# Patient Record
Sex: Female | Born: 1935 | Race: White | Hispanic: No | Smoking: Never smoker
Health system: Southern US, Community
[De-identification: ages and names within clinical notes are randomized; demographics above are authoritative.]

## PROBLEM LIST (undated history)

## (undated) DIAGNOSIS — M1990 Unspecified osteoarthritis, unspecified site: Secondary | ICD-10-CM

## (undated) DIAGNOSIS — E1149 Type 2 diabetes mellitus with other diabetic neurological complication: Secondary | ICD-10-CM

## (undated) DIAGNOSIS — G459 Transient cerebral ischemic attack, unspecified: Secondary | ICD-10-CM

## (undated) DIAGNOSIS — E876 Hypokalemia: Secondary | ICD-10-CM

## (undated) DIAGNOSIS — E039 Hypothyroidism, unspecified: Secondary | ICD-10-CM

## (undated) DIAGNOSIS — I10 Essential (primary) hypertension: Secondary | ICD-10-CM

## (undated) DIAGNOSIS — J1100 Influenza due to unidentified influenza virus with unspecified type of pneumonia: Secondary | ICD-10-CM

## (undated) DIAGNOSIS — F0390 Unspecified dementia without behavioral disturbance: Secondary | ICD-10-CM

## (undated) HISTORY — DX: Hypothyroidism, unspecified: E03.9

## (undated) HISTORY — DX: Unspecified osteoarthritis, unspecified site: M19.90

## (undated) HISTORY — PX: ABDOMINAL HYSTERECTOMY: SHX81

## (undated) HISTORY — DX: Type 2 diabetes mellitus with other diabetic neurological complication: E11.49

## (undated) HISTORY — DX: Transient cerebral ischemic attack, unspecified: G45.9

---
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
## 2000-11-17 ENCOUNTER — Emergency Department (HOSPITAL_COMMUNITY): Admission: EM | Admit: 2000-11-17 | Discharge: 2000-11-17 | Payer: Self-pay | Admitting: Emergency Medicine

## 2000-11-17 ENCOUNTER — Encounter: Payer: Self-pay | Admitting: Emergency Medicine

## 2000-11-18 ENCOUNTER — Encounter: Payer: Self-pay | Admitting: Emergency Medicine

## 2001-11-15 HISTORY — PX: OTHER SURGICAL HISTORY: SHX169

## 2004-06-19 ENCOUNTER — Emergency Department (HOSPITAL_COMMUNITY): Admission: EM | Admit: 2004-06-19 | Discharge: 2004-06-19 | Payer: Self-pay | Admitting: Emergency Medicine

## 2006-08-16 ENCOUNTER — Encounter: Admission: RE | Admit: 2006-08-16 | Discharge: 2006-08-16 | Payer: Self-pay | Admitting: Internal Medicine

## 2007-05-23 ENCOUNTER — Emergency Department (HOSPITAL_COMMUNITY): Admission: EM | Admit: 2007-05-23 | Discharge: 2007-05-23 | Payer: Self-pay | Admitting: Emergency Medicine

## 2007-08-01 ENCOUNTER — Ambulatory Visit (HOSPITAL_COMMUNITY): Admission: RE | Admit: 2007-08-01 | Discharge: 2007-08-01 | Payer: Self-pay | Admitting: Internal Medicine

## 2007-08-01 ENCOUNTER — Ambulatory Visit: Payer: Self-pay | Admitting: Vascular Surgery

## 2011-08-31 LAB — POCT URINALYSIS DIP (DEVICE)
Glucose, UA: NEGATIVE
Glucose, UA: NEGATIVE
Hgb urine dipstick: NEGATIVE
Hgb urine dipstick: NEGATIVE
Nitrite: NEGATIVE
Nitrite: NEGATIVE
Operator id: 235561
Specific Gravity, Urine: 1.025

## 2011-08-31 LAB — I-STAT 8, (EC8 V) (CONVERTED LAB)
Acid-Base Excess: 1
Chloride: 104
pCO2, Ven: 44 — ABNORMAL LOW
pH, Ven: 7.386 — ABNORMAL HIGH

## 2011-08-31 LAB — POCT I-STAT CREATININE
Creatinine, Ser: 0.8
Operator id: 116391

## 2011-08-31 LAB — DIFFERENTIAL
Basophils Absolute: 0
Eosinophils Relative: 2
Lymphocytes Relative: 24
Monocytes Absolute: 0.6

## 2011-08-31 LAB — CBC
HCT: 41.1
Hemoglobin: 14.3
MCHC: 34.6
RDW: 14.6 — ABNORMAL HIGH

## 2012-03-02 LAB — BASIC METABOLIC PANEL
Potassium: 4.3 mmol/L (ref 3.4–5.3)
Sodium: 140 mmol/L (ref 137–147)

## 2012-03-02 LAB — LIPID PANEL
Cholesterol: 185 mg/dL (ref 0–200)
HDL: 44 mg/dL (ref 35–70)
LDL Cholesterol: 98 mg/dL
Triglycerides: 216 mg/dL — AB (ref 40–160)

## 2012-03-02 LAB — TSH: TSH: 2.3 u[IU]/mL (ref 0.41–5.90)

## 2012-03-02 LAB — CBC AND DIFFERENTIAL
HCT: 44 % (ref 36–46)
Hemoglobin: 13.6 g/dL (ref 12.0–16.0)
Platelets: 231 10*3/uL (ref 150–399)
WBC: 7.7 10^3/mL

## 2012-04-04 ENCOUNTER — Other Ambulatory Visit: Payer: Self-pay | Admitting: Internal Medicine

## 2012-04-04 DIAGNOSIS — R413 Other amnesia: Secondary | ICD-10-CM

## 2012-04-05 ENCOUNTER — Other Ambulatory Visit: Payer: Self-pay

## 2012-04-05 ENCOUNTER — Ambulatory Visit
Admission: RE | Admit: 2012-04-05 | Discharge: 2012-04-05 | Disposition: A | Discharge status: Home / Self Care | Payer: Medicare Other | Source: Ambulatory Visit | Attending: Internal Medicine | Admitting: Internal Medicine

## 2012-04-05 DIAGNOSIS — R413 Other amnesia: Secondary | ICD-10-CM

## 2012-04-06 LAB — HEMOGLOBIN A1C: Hgb A1c MFr Bld: 6.5 % — AB (ref 4.0–6.0)

## 2012-04-13 ENCOUNTER — Other Ambulatory Visit: Payer: Self-pay

## 2012-04-28 ENCOUNTER — Other Ambulatory Visit: Payer: Self-pay | Admitting: Neurology

## 2012-04-28 DIAGNOSIS — R42 Dizziness and giddiness: Secondary | ICD-10-CM

## 2012-04-28 DIAGNOSIS — F09 Unspecified mental disorder due to known physiological condition: Secondary | ICD-10-CM

## 2012-04-28 DIAGNOSIS — R269 Unspecified abnormalities of gait and mobility: Secondary | ICD-10-CM

## 2012-06-15 ENCOUNTER — Ambulatory Visit
Admission: RE | Admit: 2012-06-15 | Discharge: 2012-06-15 | Disposition: A | Discharge status: Home / Self Care | Payer: Medicare Other | Source: Ambulatory Visit | Attending: Neurology | Admitting: Neurology

## 2012-06-15 DIAGNOSIS — F09 Unspecified mental disorder due to known physiological condition: Secondary | ICD-10-CM

## 2012-06-15 DIAGNOSIS — R269 Unspecified abnormalities of gait and mobility: Secondary | ICD-10-CM

## 2012-06-15 DIAGNOSIS — R42 Dizziness and giddiness: Secondary | ICD-10-CM

## 2012-10-02 ENCOUNTER — Encounter: Payer: Self-pay | Admitting: Internal Medicine

## 2012-10-08 ENCOUNTER — Encounter (HOSPITAL_COMMUNITY): Payer: Self-pay | Admitting: Nurse Practitioner

## 2012-10-08 ENCOUNTER — Emergency Department (HOSPITAL_COMMUNITY)
Admission: EM | Admit: 2012-10-08 | Discharge: 2012-10-08 | Disposition: A | Discharge status: Home / Self Care | Payer: Medicare Other | Attending: Emergency Medicine | Admitting: Emergency Medicine

## 2012-10-08 DIAGNOSIS — L0390 Cellulitis, unspecified: Secondary | ICD-10-CM

## 2012-10-08 DIAGNOSIS — F0390 Unspecified dementia without behavioral disturbance: Secondary | ICD-10-CM | POA: Insufficient documentation

## 2012-10-08 DIAGNOSIS — I8310 Varicose veins of unspecified lower extremity with inflammation: Secondary | ICD-10-CM | POA: Insufficient documentation

## 2012-10-08 DIAGNOSIS — E119 Type 2 diabetes mellitus without complications: Secondary | ICD-10-CM | POA: Insufficient documentation

## 2012-10-08 DIAGNOSIS — I10 Essential (primary) hypertension: Secondary | ICD-10-CM | POA: Insufficient documentation

## 2012-10-08 DIAGNOSIS — M7989 Other specified soft tissue disorders: Secondary | ICD-10-CM

## 2012-10-08 DIAGNOSIS — I872 Venous insufficiency (chronic) (peripheral): Secondary | ICD-10-CM

## 2012-10-08 DIAGNOSIS — L0291 Cutaneous abscess, unspecified: Secondary | ICD-10-CM | POA: Insufficient documentation

## 2012-10-08 HISTORY — DX: Unspecified dementia, unspecified severity, without behavioral disturbance, psychotic disturbance, mood disturbance, and anxiety: F03.90

## 2012-10-08 HISTORY — DX: Essential (primary) hypertension: I10

## 2012-10-08 LAB — COMPREHENSIVE METABOLIC PANEL
ALT: 14 U/L (ref 0–35)
Albumin: 3.4 g/dL — ABNORMAL LOW (ref 3.5–5.2)
Alkaline Phosphatase: 54 U/L (ref 39–117)
BUN: 21 mg/dL (ref 6–23)
Chloride: 100 mEq/L (ref 96–112)
GFR calc Af Amer: >90 mL/min (ref >90)
Glucose, Bld: 225 mg/dL — ABNORMAL HIGH (ref 70–99)
Potassium: 4.4 mEq/L (ref 3.5–5.1)
Sodium: 137 mEq/L (ref 135–145)
Total Bilirubin: 1 mg/dL (ref 0.3–1.2)

## 2012-10-08 LAB — CBC WITH DIFFERENTIAL/PLATELET
Hemoglobin: 13 g/dL (ref 12.0–15.0)
Lymphs Abs: 2.1 10*3/uL (ref 0.7–4.0)
Monocytes Relative: 6 % (ref 3–12)
Neutro Abs: 3.6 10*3/uL (ref 1.7–7.7)
Neutrophils Relative %: 56 % (ref 43–77)
RBC: 3.9 MIL/uL (ref 3.87–5.11)
WBC: 6.5 10*3/uL (ref 4.0–10.5)

## 2012-10-08 LAB — URINALYSIS, ROUTINE W REFLEX MICROSCOPIC
Bilirubin Urine: NEGATIVE
Glucose, UA: NEGATIVE mg/dL
Ketones, ur: NEGATIVE mg/dL
Protein, ur: NEGATIVE mg/dL

## 2012-10-08 LAB — URINE MICROSCOPIC-ADD ON

## 2012-10-08 MED ORDER — CLINDAMYCIN PHOSPHATE 600 MG/50ML IV SOLN
600.0000 mg | Freq: Once | INTRAVENOUS | Status: AC
  Administered 2012-10-08: 600 mg via INTRAVENOUS
  Filled 2012-10-08: qty 50

## 2012-10-08 MED ORDER — CEPHALEXIN 500 MG PO CAPS: 500.0000 mg | ORAL_CAPSULE | Freq: Four times a day (QID) | ORAL | Status: DC

## 2012-10-08 NOTE — ED Provider Notes (Signed)
History     CSN: [id number]  Arrival date & time 10/08/12  1025   First MD Initiated Contact with Patient 10/08/12 1027      Chief Complaint  Patient presents with  . Leg Swelling    (Consider location/radiation/quality/duration/timing/severity/associated sxs/prior treatment) HPI Comments: Patient with history of diabetes presents with complaint of bilateral lower extremity edema for the past 2-3 weeks. She has had some trouble with ambulation however she can ambulate. She states that she feels like her left leg is worse in her right. She has tenderness to her left leg. She denies fever, shortness of breath, chest pain. She does not have history of congestive heart failure, liver disease, kidney disease that she is aware of. She is not on any fluid pills. Onset gradual. Course is gradually worsening. Nothing makes symptoms better.   The history is provided by the patient.    Past Medical History  Diagnosis Date  . Diabetes mellitus without complication   . Hypertension   . Dementia     History reviewed. No pertinent past surgical history.  History reviewed. No pertinent family history.  History  Substance Use Topics  . Smoking status: Not on file  . Smokeless tobacco: Not on file  . Alcohol Use: No    OB History    Grav Para Term Preterm Abortions TAB SAB Ect Mult Living                  Review of Systems  Constitutional: Negative for fever.  HENT: Negative for sore throat and rhinorrhea.   Eyes: Negative for redness.  Respiratory: Negative for cough and shortness of breath.   Cardiovascular: Positive for leg swelling. Negative for chest pain.  Gastrointestinal: Negative for nausea, vomiting, abdominal pain and diarrhea.  Genitourinary: Negative for dysuria.  Musculoskeletal: Negative for myalgias.  Skin: Positive for color change. Negative for rash.  Neurological: Negative for headaches.    Allergies  Review of patient's allergies indicates no known  allergies.  Home Medications  No current outpatient prescriptions on file.  BP 172/49  Pulse 76  Temp 98.3 F (36.8 C) (Oral)  Resp 16  SpO2 96%  Physical Exam  Nursing note and vitals reviewed. Constitutional: She appears well-developed and well-nourished.  HENT:  Head: Normocephalic and atraumatic.  Eyes: Conjunctivae normal are normal. Right eye exhibits no discharge. Left eye exhibits no discharge.  Neck: Normal range of motion. Neck supple.  Cardiovascular: Normal rate, regular rhythm and normal heart sounds.   Pulmonary/Chest: Effort normal and breath sounds normal.  Abdominal: Soft. There is no tenderness.  Musculoskeletal: She exhibits edema.       Bilateral LE 2-3+ edema to knees. There is redness and warmth of L foot and ankle with mild tenderness. Scattered varicosities bilaterally.   Neurological: She is alert.  Skin: Skin is warm and dry.  Psychiatric: She has a normal mood and affect.    ED Course  Procedures (including critical care time)  Labs Reviewed  CBC WITH DIFFERENTIAL - Abnormal; Notable for the following:    Eosinophils Relative 6 (*)     All other components within normal limits  COMPREHENSIVE METABOLIC PANEL - Abnormal; Notable for the following:    Glucose, Bld 225 (*)     Albumin 3.4 (*)     GFR calc non Af Amer 81 (*)     All other components within normal limits  URINALYSIS, ROUTINE W REFLEX MICROSCOPIC - Abnormal; Notable for the following:    APPearance  CLOUDY (*)     Leukocytes, UA MODERATE (*)     All other components within normal limits  URINE MICROSCOPIC-ADD ON - Abnormal; Notable for the following:    Squamous Epithelial / LPF MANY (*)     Bacteria, UA MANY (*)     All other components within normal limits  URINE CULTURE   No results found.   1. Venous stasis dermatitis   2. Cellulitis     10:38 AM Patient seen and examined. Work-up initiated. Medications ordered. D/w Dr. Silverio Lay.   Vital signs reviewed and are as  follows: Filed Vitals:   10/08/12 1033  BP: 172/49  Pulse: 76  Temp: 98.3 F (36.8 C)  Resp: 16   12:42 PM Duplex neg for DVT although some vessels not seen. Do not suspect DVT given these findings.   Findings d/w patient and family. She is comfortable with d/c home and PCP f/u as planned this week. Urged elevation.   Pt urged to return with worsening pain, worsening swelling, expanding area of redness or streaking up extremity, fever, or any other concerns. Urged to take complete course of antibiotics as prescribed. Pt verbalizes understanding and agrees with plan.    MDM  Venous stasis and cellulitis -- DVT not likely, neg venous US. Patient non-toxic. 2+ pedal pulses.   Possible UTI - culture sent. Keflex may treat.         Renne Crigler, [place] 10/08/12 1600

## 2012-10-08 NOTE — Progress Notes (Signed)
*  Preliminary Results* Bilateral lower extremity venous duplex completed. Visualized veins of bilateral lower extremities are negative for deep vein thrombosis. Unable to visualize right peroneal veins and left posterior tibial and peroneal veins, therefore unable to exclude deep vein thrombosis in these vessels. No evidence of Baker's cyst bilaterally.  10/08/2012 11:31 AM Gertie Fey, RDMS, RDCS

## 2012-10-08 NOTE — ED Notes (Signed)
Per ems: pt from [facility] with lower extremity edema x 2 weeks. Ambulatory with assistance. 2/10 pain to BLE, LLE felt warm to touch, +3 pitting edema noted bilaterally, pain increased on palpation. Denies SOB or CP. Denies hx of CHF

## 2012-10-08 NOTE — ED Notes (Signed)
ultrasound at bedside.

## 2012-10-08 NOTE — ED Notes (Signed)
PA at bedside.

## 2012-10-09 NOTE — ED Provider Notes (Signed)
Medical screening examination/treatment/procedure(s) were conducted as a shared visit with non-physician practitioner(s) and myself.  I personally evaluated the patient during the encounter  Gal Smolinski is a 78 y.o. female hx of DM, HTN, mild dementia here with bilateral leg swelling for several weeks. No SOB and no hx of CHF. DVT study negative. She has venous stasis and possibly cellulitis on L leg. She also have UTI on UA without symptoms. She was d/c home on keflex.    Richardean Canal, MD 10/09/12 1500

## 2012-10-11 LAB — URINE CULTURE: Colony Count: 45000

## 2013-02-02 ENCOUNTER — Ambulatory Visit (INDEPENDENT_AMBULATORY_CARE_PROVIDER_SITE_OTHER): Payer: Medicare Other | Admitting: Internal Medicine

## 2013-02-02 ENCOUNTER — Encounter: Payer: Self-pay | Admitting: Internal Medicine

## 2013-02-02 ENCOUNTER — Other Ambulatory Visit (INDEPENDENT_AMBULATORY_CARE_PROVIDER_SITE_OTHER): Payer: Medicare Other

## 2013-02-02 VITALS — BP 152/68 | HR 80 | Temp 98.0°F | Ht 65.0 in | Wt 195.0 lb

## 2013-02-02 DIAGNOSIS — E039 Hypothyroidism, unspecified: Secondary | ICD-10-CM

## 2013-02-02 DIAGNOSIS — F0390 Unspecified dementia without behavioral disturbance: Secondary | ICD-10-CM | POA: Insufficient documentation

## 2013-02-02 DIAGNOSIS — I10 Essential (primary) hypertension: Secondary | ICD-10-CM

## 2013-02-02 DIAGNOSIS — E1142 Type 2 diabetes mellitus with diabetic polyneuropathy: Secondary | ICD-10-CM

## 2013-02-02 DIAGNOSIS — E1149 Type 2 diabetes mellitus with other diabetic neurological complication: Secondary | ICD-10-CM

## 2013-02-02 DIAGNOSIS — G459 Transient cerebral ischemic attack, unspecified: Secondary | ICD-10-CM | POA: Insufficient documentation

## 2013-02-02 LAB — LIPID PANEL
HDL: 34.1 mg/dL — ABNORMAL LOW (ref >39.00)
Total CHOL/HDL Ratio: 4
Triglycerides: 230 mg/dL — ABNORMAL HIGH (ref 0.0–149.0)
VLDL: 46 mg/dL — ABNORMAL HIGH (ref 0.0–40.0)

## 2013-02-02 LAB — LDL CHOLESTEROL, DIRECT: Direct LDL: 76.5 mg/dL

## 2013-02-02 LAB — HEMOGLOBIN A1C: Hgb A1c MFr Bld: 6.7 % — ABNORMAL HIGH (ref 4.6–6.5)

## 2013-02-02 LAB — MICROALBUMIN / CREATININE URINE RATIO: Microalb Creat Ratio: 1 mg/g (ref 0.0–30.0)

## 2013-02-02 MED ORDER — INSULIN NPH ISOPHANE & REGULAR (70-30) 100 UNIT/ML ~~LOC~~ SUSP: 30.0000 [IU] | Freq: Two times a day (BID) | SUBCUTANEOUS | Status: DC

## 2013-02-02 MED ORDER — GLIPIZIDE 5 MG PO TABS: 5.0000 mg | ORAL_TABLET | Freq: Two times a day (BID) | ORAL | Status: DC

## 2013-02-02 MED ORDER — DONEPEZIL HCL 5 MG PO TABS: 5.0000 mg | ORAL_TABLET | Freq: Every evening | ORAL | Status: DC | PRN

## 2013-02-02 NOTE — Patient Instructions (Signed)
It was good to see you today. We have reviewed your prior records including labs and tests today Test(s) ordered today. Your results will be released to MyChart (or called to you) after review, usually within 72hours after test completion. If any changes need to be made, you will be notified at that same time. Medications reviewed - change insulin to pen and use 30 units 2x/day Start low dose generic Aricept for memory as discussed No other changes recommended  Your prescription(s) have been submitted to your pharmacy. Please take as directed and contact our office if you believe you are having problem(s) with the medication(s). Continue working with Dr Anne Hahn and other specialists as ongoing Please schedule followup in 3-4 months for diabetes mellitus check, call sooner if problems.

## 2013-02-02 NOTE — Progress Notes (Signed)
Subjective:    Patient ID: Carol Santiago, female    DOB: October 13, 1925, 79 y.o.   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 Reports occ symptoms hypoglycemia. Son concerned re: amount of insulin pt drawing up/administering to self (dementia)  Hypertension - the patient reports compliance with medication(s) as prescribed. Denies adverse side effects.  Hypothyroid - the patient reports compliance with medication(s) as prescribed. Denies adverse side effects.  Dementia - follows with neuro Anne Hahn) for same - reports not ever on meds for same (though neuro note reports otherwise) - moved to [facility] summer 2013 to be close to family due to same - in [facility] at [facility] since 09/2012  Past Medical History  Diagnosis Date  . Diabetes mellitus type 2 with neurological manifestations   . Hypertension   . Dementia   . Arthritis   . Hypothyroid   . TIA (transient ischemic attack)   . Osteoarthritis    Family History  Problem Relation Age of Onset  . Arthritis Mother   . Stroke Mother   . Hypertension Mother   . Colon cancer Father   . Lung cancer Brother   . Kidney cancer Brother   . Diabetes Maternal Aunt    History  Substance Use Topics  . Smoking status: Never Smoker   . Smokeless tobacco: Not on file  . Alcohol Use: No    Review of Systems Constitutional: Negative for fever or weight change.  Respiratory: Negative for cough and shortness of breath.   Cardiovascular: Negative for chest pain or palpitations.  Gastrointestinal: Negative for abdominal pain, no bowel changes.  Musculoskeletal: Negative for gait problem or joint swelling.  Skin: Negative for rash.  Neurological: Negative for dizziness or headache.  No other  specific complaints in a complete review of systems (except as listed in HPI above).     Objective:   Physical Exam BP 152/68  Pulse 80  Temp(Src) 98 F (36.7 C) (Oral)  Ht 5\' 5"  (1.651 m)  Wt 195 lb (88.451 kg)  BMI 32.45 kg/m2  SpO2 95% Wt Readings from Last 3 Encounters:  02/02/13 195 lb (88.451 kg)   Constitutional: She appears well-developed and well-nourished. No distress. Mildly disheveled. Son (POA) at side  HENT: Head: Normocephalic and atraumatic. Ears: HOH B. B TMs ok, no erythema or effusion; Nose: Nose normal. Mouth/Throat: Oropharynx is clear and moist. No oropharyngeal exudate.  Eyes: Conjunctivae and EOM are normal. Pupils are equal, round, and reactive to light. No scleral icterus.  Neck: Normal range of motion. Neck supple. No JVD present. No thyromegaly present.  Cardiovascular: Normal rate, regular rhythm and normal heart sounds.  No murmur heard. No BLE edema. Pulmonary/Chest: Effort normal and breath sounds normal. No respiratory distress. She has no wheezes.  Abdominal: Soft. Bowel sounds are normal. She exhibits no distension. There is no tenderness. no masses Musculoskeletal: Normal range of motion, no joint effusions. No gross deformities Neurological: She is alert and oriented to person, place, and time. Fair recall. No cranial nerve deficit. Coordination, speech and balance normal. Walks with 4pt cane for support.  Skin: Skin is pale tone, but warm and dry. No rash noted. No erythema. mild venous stasis changes distal BLE Psychiatric: She has a normal  mood and affect. Her behavior is pleasant and interactive, but distracted. Judgment and thought content fair.   Lab Results  Component Value Date   WBC 6.5 10/08/2012   HGB 13.0 10/08/2012   HCT 38.6 10/08/2012   PLT 180 10/08/2012   GLUCOSE 225* 10/08/2012   ALT 14 10/08/2012   AST 25 10/08/2012   NA 137 10/08/2012   K 4.4 10/08/2012   CL 100 10/08/2012   CREATININE 0.60 10/08/2012   BUN 21  10/08/2012   CO2 26 10/08/2012       Assessment & Plan:   See problem list. Medications and labs reviewed today.  Time spent with pt/family today 60 minutes, greater than 50% time spent counseling patient on diabetes, dementia and medication review. Also review of prior records available (brought by son and in EMR)

## 2013-02-04 NOTE — Assessment & Plan Note (Signed)
Check TSH now and annually, adjust dose as needed Lab Results  Component Value Date   TSH 2.65 02/02/2013

## 2013-02-04 NOTE — Assessment & Plan Note (Signed)
We reviewed hypoglycemic events and concern for dementia affecting pt's ability to draw up and administer proper rx 70/30 Change to Pen administration 70/30 and reduce dose of same (40U BID, advise 30 U BID), also reduce OHA dose Consider DC of either med if hypoglycemia persists Reports intol of statin - on ACEI Follows with optho annually Check a1c q3-5mo and adjust prn Lab Results  Component Value Date   HGBA1C 6.7* 02/02/2013

## 2013-02-04 NOTE — Assessment & Plan Note (Signed)
BP Readings from Last 3 Encounters:  02/02/13 152/68  10/08/12 132/67   subop control of same today - but son/pt report usually well controlled As the current medical regimen is generally effective;  continue present plan and medications.  Family/pt to monitor and call if SBP>140

## 2013-02-04 NOTE — Assessment & Plan Note (Signed)
Follows with neuro (willis) for same Never began aricept as rx'd 07/2012 (per EMR notes) or increase dose 09/2012 as rx'd Start low dose now - reviewed same with POA/son and pt we reviewed potential risk/benefit and possible side effects - pt understands and agrees to same  To follow up with neuro on same

## 2013-02-24 ENCOUNTER — Other Ambulatory Visit: Payer: Self-pay | Admitting: Neurology

## 2013-04-23 ENCOUNTER — Ambulatory Visit (INDEPENDENT_AMBULATORY_CARE_PROVIDER_SITE_OTHER): Payer: Medicare Other | Admitting: Internal Medicine

## 2013-04-23 ENCOUNTER — Encounter: Payer: Self-pay | Admitting: Internal Medicine

## 2013-04-23 VITALS — BP 128/62 | HR 77 | Temp 97.6°F | Ht 65.0 in | Wt 192.0 lb

## 2013-04-23 DIAGNOSIS — E1142 Type 2 diabetes mellitus with diabetic polyneuropathy: Secondary | ICD-10-CM

## 2013-04-23 DIAGNOSIS — E1149 Type 2 diabetes mellitus with other diabetic neurological complication: Secondary | ICD-10-CM

## 2013-04-23 NOTE — Patient Instructions (Signed)

## 2013-04-23 NOTE — Progress Notes (Signed)
Subjective:    Patient ID: Carol Santiago, female    DOB: [birth year], 79 y.o.   MRN: [id number]  HPI  Pt presents to the clinic today with c/o hypoglycemic events. This has been going on for the past month. She is not checking her blood sugar. When she has these episodes, she feels shaky. Son is concerned with the patients ability to give herself the correct amount of insulin each day. At her last visit, Dr. Felicity Coyer switched her from the vials to the pens and reduce her insulin down to 30 units BID from 40 units BID. She is still using the vials because her insurance would not pay for the pens. She has titrated her insulin down to 15 unit BID. Her last A1C in March 2014 was 6.7.   Review of Systems      Past Medical History  Diagnosis Date  . Diabetes mellitus type 2 with neurological manifestations   . Hypertension   . Dementia   . Arthritis   . Hypothyroid   . TIA (transient ischemic attack)   . Osteoarthritis     Current Outpatient Prescriptions  Medication Sig Dispense Refill  . donepezil (ARICEPT) 5 MG tablet Take 1 tablet (5 mg total) by mouth at bedtime as needed.  30 tablet  3  . glipiZIDE (GLUCOTROL) 5 MG tablet Take 1 tablet (5 mg total) by mouth 2 (two) times daily before a meal.  60 tablet  3  . insulin NPH-insulin regular (HUMULIN 70/30 KWIKPEN) (70-30) 100 UNIT/ML injection Inject 30 Units into the skin 2 (two) times daily with a meal.  10 mL  12  . levothyroxine (SYNTHROID, LEVOTHROID) 75 MCG tablet TAKE ONE TABLET BY MOUTH EVERY DAY  90 tablet  2  . lisinopril (PRINIVIL,ZESTRIL) 5 MG tablet TAKE ONE TABLET BY MOUTH EVERY DAY  90 tablet  2   No current facility-administered medications for this visit.    No Known Allergies  Family History  Problem Relation Age of Onset  . Arthritis Mother   . Stroke Mother   . Hypertension Mother   . Colon cancer Father   . Lung cancer Brother   . Kidney cancer Brother   . Diabetes Maternal Aunt     History   Social  History  . Marital Status: Divorced    Spouse Name: N/A    Number of Children: N/A  . Years of Education: N/A   Occupational History  . Not on file.   Social History Main Topics  . Smoking status: Never Smoker   . Smokeless tobacco: Not on file  . Alcohol Use: No  . Drug Use: No  . Sexually Active: Not on file   Other Topics Concern  . Not on file   Social History Narrative  . No narrative on file     Constitutional: Denies fever, malaise, fatigue, headache or abrupt weight changes.  HEENT: Denies eye pain, eye redness, ear pain, ringing in the ears, wax buildup, runny nose, nasal congestion, bloody nose, or sore throat. Respiratory: Denies difficulty breathing, shortness of breath, cough or sputum production.   Cardiovascular: Denies chest pain, chest tightness, palpitations or swelling in the hands or feet.   Neurological: Pt reports difficulty with memory. Denies dizziness, difficulty with speech or problems with balance and coordination.   No other specific complaints in a complete review of systems (except as listed in HPI above).  Objective:   Physical Exam    BP 128/62  Pulse 77  Temp(Src) 97.6  F (36.4 C) (Oral)  Ht 5\' 5"  (1.651 m)  Wt 192 lb (87.091 kg)  BMI 31.95 kg/m2  SpO2 96% Wt Readings from Last 3 Encounters:  04/23/13 192 lb (87.091 kg)  02/02/13 195 lb (88.451 kg)    General: Appears her stated age, well developed, well nourished in NAD. Skin: Warm, dry and intact. No rashes, lesions or ulcerations noted. HEENT: Head: normal shape and size; Eyes: sclera white, no icterus, conjunctiva pink, PERRLA and EOMs intact; Ears: Tm's gray and intact, normal light reflex; Nose: mucosa pink and moist, septum midline; Throat/Mouth: Teeth present, mucosa pink and moist, no exudate, lesions or ulcerations noted.  Neck: Normal range of motion. Neck supple, trachea midline. No massses, lumps or thyromegaly present.  Cardiovascular: Normal rate and rhythm. S1,S2  noted.  No murmur, rubs or gallops noted. No JVD or BLE edema. No carotid bruits noted. Pulmonary/Chest: Normal effort and positive vesicular breath sounds. No respiratory distress. No wheezes, rales or ronchi noted.   Neurological: Alert and oriented. Cranial nerves II-XII intact. Coordination normal. +DTRs bilaterally.  BMET    Component Value Date/Time   NA 137 10/08/2012 1050   NA 140 03/02/2012   K 4.4 10/08/2012 1050   CL 100 10/08/2012 1050   CO2 26 10/08/2012 1050   GLUCOSE 225* 10/08/2012 1050   BUN 21 10/08/2012 1050   BUN 23* 03/02/2012   CREATININE 0.60 10/08/2012 1050   CREATININE 0.7 03/02/2012   CALCIUM 9.2 10/08/2012 1050   GFRNONAA 81* 10/08/2012 1050   GFRAA >90 10/08/2012 1050    Lipid Panel     Component Value Date/Time   CHOL 142 02/02/2013 1053   TRIG 230.0* 02/02/2013 1053   HDL 34.10* 02/02/2013 1053   CHOLHDL 4 02/02/2013 1053   VLDL 46.0* 02/02/2013 1053   LDLCALC 98 03/02/2012    CBC    Component Value Date/Time   WBC 6.5 10/08/2012 1050   WBC 7.7 03/02/2012   RBC 3.90 10/08/2012 1050   HGB 13.0 10/08/2012 1050   HCT 38.6 10/08/2012 1050   PLT 180 10/08/2012 1050   MCV 99.0 10/08/2012 1050   MCH 33.3 10/08/2012 1050   MCHC 33.7 10/08/2012 1050   RDW 13.7 10/08/2012 1050   LYMPHSABS 2.1 10/08/2012 1050   MONOABS 0.4 10/08/2012 1050   EOSABS 0.4 10/08/2012 1050   BASOSABS 0.0 10/08/2012 1050    Hgb A1C Lab Results  Component Value Date   HGBA1C 6.7* 02/02/2013       Assessment & Plan:

## 2013-04-23 NOTE — Assessment & Plan Note (Signed)
Ongoing hypoglycemic episodes Will d/c insulin at this time Continue with glipizide  RTC in 2 months to recheck A1C

## 2013-05-28 ENCOUNTER — Other Ambulatory Visit (INDEPENDENT_AMBULATORY_CARE_PROVIDER_SITE_OTHER): Payer: Medicare Other

## 2013-05-28 ENCOUNTER — Encounter: Payer: Self-pay | Admitting: Internal Medicine

## 2013-05-28 ENCOUNTER — Ambulatory Visit (INDEPENDENT_AMBULATORY_CARE_PROVIDER_SITE_OTHER): Payer: Medicare Other | Admitting: Internal Medicine

## 2013-05-28 VITALS — BP 162/70 | HR 75 | Temp 97.7°F | Wt 188.8 lb

## 2013-05-28 DIAGNOSIS — N390 Urinary tract infection, site not specified: Secondary | ICD-10-CM

## 2013-05-28 DIAGNOSIS — I10 Essential (primary) hypertension: Secondary | ICD-10-CM

## 2013-05-28 DIAGNOSIS — F0390 Unspecified dementia without behavioral disturbance: Secondary | ICD-10-CM

## 2013-05-28 DIAGNOSIS — E785 Hyperlipidemia, unspecified: Secondary | ICD-10-CM

## 2013-05-28 DIAGNOSIS — R4182 Altered mental status, unspecified: Secondary | ICD-10-CM

## 2013-05-28 DIAGNOSIS — G459 Transient cerebral ischemic attack, unspecified: Secondary | ICD-10-CM

## 2013-05-28 LAB — BASIC METABOLIC PANEL
BUN: 23 mg/dL (ref 6–23)
CO2: 29 mEq/L (ref 19–32)
Chloride: 103 mEq/L (ref 96–112)
GFR: 80.3 mL/min (ref >60.00)
Glucose, Bld: 176 mg/dL — ABNORMAL HIGH (ref 70–99)
Potassium: 4.1 mEq/L (ref 3.5–5.1)
Sodium: 138 mEq/L (ref 135–145)

## 2013-05-28 LAB — CBC WITH DIFFERENTIAL/PLATELET
Basophils Absolute: 0 10*3/uL (ref 0.0–0.1)
HCT: 41.6 % (ref 36.0–46.0)
Hemoglobin: 14.3 g/dL (ref 12.0–15.0)
Lymphs Abs: 2.1 10*3/uL (ref 0.7–4.0)
MCHC: 34.4 g/dL (ref 30.0–36.0)
MCV: 98.3 fl (ref 78.0–100.0)
Monocytes Absolute: 0.5 10*3/uL (ref 0.1–1.0)
Monocytes Relative: 6.3 % (ref 3.0–12.0)
Neutro Abs: 5.6 10*3/uL (ref 1.4–7.7)
RDW: 14.6 % (ref 11.5–14.6)

## 2013-05-28 LAB — URINALYSIS, ROUTINE W REFLEX MICROSCOPIC
Bilirubin Urine: NEGATIVE
Hgb urine dipstick: NEGATIVE
Ketones, ur: NEGATIVE
Total Protein, Urine: NEGATIVE
pH: 5 (ref 5.0–8.0)

## 2013-05-28 LAB — TSH: TSH: 1.71 u[IU]/mL (ref 0.35–5.50)

## 2013-05-28 LAB — HEPATIC FUNCTION PANEL
Alkaline Phosphatase: 50 U/L (ref 39–117)
Bilirubin, Direct: 0.2 mg/dL (ref 0.0–0.3)
Total Bilirubin: 1.7 mg/dL — ABNORMAL HIGH (ref 0.3–1.2)
Total Protein: 7.4 g/dL (ref 6.0–8.3)

## 2013-05-28 LAB — LIPID PANEL
Total CHOL/HDL Ratio: 4
VLDL: 56.2 mg/dL — ABNORMAL HIGH (ref 0.0–40.0)

## 2013-05-28 MED ORDER — LISINOPRIL-HYDROCHLOROTHIAZIDE 10-12.5 MG PO TABS: 1.0000 | ORAL_TABLET | Freq: Every day | ORAL | Status: DC

## 2013-05-28 MED ORDER — CLOPIDOGREL BISULFATE 75 MG PO TABS: 75.0000 mg | ORAL_TABLET | Freq: Every day | ORAL | Status: DC

## 2013-05-28 NOTE — Assessment & Plan Note (Signed)
Follows with neuro (willis) for same Never began aricept as rx'd 07/2012 (per EMR notes) or increase dose 09/2012 as rx'd recommended to start low dose 01/2013, ?compliance with same- reviewed same with son and pt To follow up with neuro on same - will help arrange, esp in setting of ?recurrent TIA

## 2013-05-28 NOTE — Patient Instructions (Addendum)
It was good to see you today. We have reviewed your prior records including labs and tests today Test(s) ordered today. Labs, MRI, carotid Doppler and ultrasound of heart (echocardiogram) - Our office will contact you regarding appointment(s) once made. Your results will be released to MyChart (or called to you) after review, usually within 72hours after test completion. If any changes need to be made, you will be notified at that same time.  Medications reviewed and updated -begin Plavix once daily in addition to baby aspirin, Change lisinopril 5 mg to lisinopril 10/12.5 once daily -  Your prescription(s) have been submitted to your pharmacy. Please take as directed and contact our office if you believe you are having problem(s) with the medication(s). Will also refer to physical therapy Please schedule followup in 3-4 weeks to review, call sooner if problems.

## 2013-05-28 NOTE — Progress Notes (Signed)
Subjective:    Patient ID: Carol Santiago, female    DOB: Jul 02, 1925, 79 y.o.   MRN: [id number]  HPI Comments: Family concerned with pt having "trouble talking" - "can't find the words to finish a sentence and its confusing". But denies abnormal thought process or behavior. Pt feels frustrated but not confused.  Altered Mental Status This is a new problem. The current episode started in the past 7 days. The problem occurs intermittently. The problem has been unchanged. Associated symptoms include headaches (R temple x 4 days) and myalgias. Pertinent negatives include no abdominal pain, anorexia, arthralgias, chest pain, chills, congestion, coughing, fatigue, fever, joint swelling, nausea, neck pain, numbness, rash, sore throat, swollen glands, urinary symptoms, vertigo, visual change, vomiting or weakness. Nothing aggravates the symptoms. She has tried rest for the symptoms. The treatment provided no relief.    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 Reports occ symptoms hypoglycemia. Son concerned re: amount of insulin pt drawing up/administering to self (dementia)  Hypertension - the patient reports compliance with medication(s) as prescribed. Denies adverse side effects.  Hypothyroid - the patient reports compliance with medication(s) as prescribed. Denies adverse side effects.  Dementia - follows with neuro Anne Hahn) for same - reports not ever on meds for same (though neuro note reports otherwise) - moved to [facility] summer 2013 to be close to family due to same - in [facility] at [facility] since 09/2012  Past Medical History  Diagnosis Date  . Diabetes mellitus type 2 with neurological manifestations   . Hypertension   . Dementia   . Arthritis   . Hypothyroid   . TIA (transient ischemic attack)   . Osteoarthritis    Family History  Problem Relation Age  of Onset  . Arthritis Mother   . Stroke Mother   . Hypertension Mother   . Colon cancer Father   . Lung cancer Brother   . Kidney cancer Brother   . Diabetes Maternal Aunt    History  Substance Use Topics  . Smoking status: Never Smoker   . Smokeless tobacco: Not on file  . Alcohol Use: No    Review of Systems  Constitutional: Negative for fever, chills and fatigue.  HENT: Negative for congestion, sore throat and neck pain.   Respiratory: Negative for cough.   Cardiovascular: Negative for chest pain.  Gastrointestinal: Negative for nausea, vomiting, abdominal pain and anorexia.  Musculoskeletal: Positive for myalgias. Negative for joint swelling and arthralgias.  Skin: Negative for rash.  Neurological: Positive for headaches (R temple x 4 days). Negative for vertigo, weakness and numbness.  Psychiatric/Behavioral: Positive for altered mental status.  all other systems reviewed and negative except as listed      Objective:   Physical Exam  BP 162/70  Pulse 75  Temp(Src) 97.7 F (36.5 C) (Oral)  Wt 188 lb 12.8 oz (85.639 kg)  BMI 31.42 kg/m2  SpO2 94% Wt Readings from Last 3 Encounters:  05/28/13 188 lb 12.8 oz (85.639 kg)  04/23/13 192 lb (87.091 kg)  02/02/13 195 lb (88.451 kg)   Constitutional: She appears well-developed and well-nourished. No distress. Mildly disheveled. Son Larita Fife (previously [facility]) at side  HENT: Head: Normocephalic and atraumatic. Ears: HOH B. B TMs ok, no erythema or effusion; Nose: Nose normal. Mouth/Throat: Oropharynx is clear and moist. No oropharyngeal exudate.  Eyes: Conjunctivae and EOM are normal. Pupils are  equal, round, and reactive to light. No scleral icterus.  Neck: Normal range of motion. Neck supple. No JVD present. No thyromegaly present.  Cardiovascular: Normal rate, regular rhythm and normal heart sounds.  No murmur heard. No BLE edema. Pulmonary/Chest: Effort normal and breath sounds normal. No respiratory distress. She has  no wheezes.  Abdominal: Soft. Bowel sounds are normal. She exhibits no distension. There is no tenderness. no masses Musculoskeletal: Normal range of motion, no joint effusions. No gross deformities Neurological: Speech with hesitancy - difficulty word finding and sentence completion. She is alert and oriented to person, place, and time. Fair recall, 2 of 3 at 3 min (unchanged). No cranial nerve deficit. Coordination and balance baseline: walks with 4pt cane for support.  Skin: Skin is pale tone, but warm and dry. No rash noted. No erythema. mild venous stasis changes distal BLE Psychiatric: She has a normal mood and affect. Her behavior is pleasant and interactive, but distracted. Judgment and thought content fair.   Lab Results  Component Value Date   WBC 6.5 10/08/2012   HGB 13.0 10/08/2012   HCT 38.6 10/08/2012   PLT 180 10/08/2012   GLUCOSE 225* 10/08/2012   CHOL 142 02/02/2013   TRIG 230.0* 02/02/2013   HDL 34.10* 02/02/2013   LDLDIRECT 76.5 02/02/2013   LDLCALC 98 03/02/2012   ALT 14 10/08/2012   AST 25 10/08/2012   NA 137 10/08/2012   K 4.4 10/08/2012   CL 100 10/08/2012   CREATININE 0.60 10/08/2012   BUN 21 10/08/2012   CO2 26 10/08/2012   TSH 2.65 02/02/2013   HGBA1C 6.7* 02/02/2013   MICROALBUR 2.9* 02/02/2013       Assessment & Plan:   "altered mental status", ?TIA -history and exam were consistent with expressive aphasia, subacute in onset, clinically concerning for recurrent TIA. Check baseline labs to rule out infection, especially UTI or abdominal source, LFTs, TSH and hydration status. Recommend addition of Plavix to ongoing aspirin 81 mg daily for recurrent TIA symptoms and arrange for a new MRI brain with carotid Dopplers and 2-D echo. Arrange for home health physical therapy/occupational and speech therapy to evaluate and treat. Education provided to patient and family on same. To followup in 2-4 weeks to review test results and symptoms, patient to call or to the  emergency room if problems prior to that time  Also see problem list. Medications and labs reviewed today.  Time spent with pt/family today 42 minutes, greater than 50% time spent counseling on evaluation for AMS, ?TIA, symptoms; also on diabetes, dementia and medication review. Also review of prior records in EMR

## 2013-05-28 NOTE — Assessment & Plan Note (Signed)
History of same, no prior residual deficits. Ongoing medical management to prevent and reduce likelihood of recurrence. Follows with neurology for same (willis) Add Plavix to ASA 81 and continue risk stratification medical management

## 2013-05-28 NOTE — Assessment & Plan Note (Signed)
BP Readings from Last 3 Encounters:  05/28/13 162/70  04/23/13 128/62  02/02/13 152/68   subop control of same today - but son/pt report usually well controlled Change lisinopril 5 mg to combination lisinopril 10 mg with HCTZ once daily ( may also help with chronic peripheral edema) -new erx done

## 2013-05-29 DIAGNOSIS — E785 Hyperlipidemia, unspecified: Secondary | ICD-10-CM | POA: Insufficient documentation

## 2013-05-29 MED ORDER — PRAVASTATIN SODIUM 20 MG PO TABS: 20.0000 mg | ORAL_TABLET | Freq: Every day | ORAL | Status: DC

## 2013-05-29 MED ORDER — CEFUROXIME AXETIL 250 MG PO TABS: 250.0000 mg | ORAL_TABLET | Freq: Two times a day (BID) | ORAL | Status: DC

## 2013-05-29 NOTE — Addendum Note (Signed)
Addended by: Rene Paci A on: 05/29/2013 01:13 PM   Modules accepted: Orders

## 2013-06-04 ENCOUNTER — Other Ambulatory Visit (HOSPITAL_COMMUNITY): Payer: Medicare Other

## 2013-06-07 ENCOUNTER — Ambulatory Visit (HOSPITAL_COMMUNITY): Payer: Medicare Other | Attending: Internal Medicine | Admitting: Radiology

## 2013-06-07 ENCOUNTER — Encounter (INDEPENDENT_AMBULATORY_CARE_PROVIDER_SITE_OTHER): Payer: Medicare Other

## 2013-06-07 DIAGNOSIS — I10 Essential (primary) hypertension: Secondary | ICD-10-CM

## 2013-06-07 DIAGNOSIS — F0390 Unspecified dementia without behavioral disturbance: Secondary | ICD-10-CM

## 2013-06-07 DIAGNOSIS — E119 Type 2 diabetes mellitus without complications: Secondary | ICD-10-CM | POA: Insufficient documentation

## 2013-06-07 DIAGNOSIS — R404 Transient alteration of awareness: Secondary | ICD-10-CM

## 2013-06-07 DIAGNOSIS — R4701 Aphasia: Secondary | ICD-10-CM

## 2013-06-07 DIAGNOSIS — E669 Obesity, unspecified: Secondary | ICD-10-CM | POA: Insufficient documentation

## 2013-06-07 DIAGNOSIS — R4182 Altered mental status, unspecified: Secondary | ICD-10-CM

## 2013-06-07 DIAGNOSIS — I379 Nonrheumatic pulmonary valve disorder, unspecified: Secondary | ICD-10-CM | POA: Insufficient documentation

## 2013-06-07 DIAGNOSIS — G459 Transient cerebral ischemic attack, unspecified: Secondary | ICD-10-CM | POA: Insufficient documentation

## 2013-06-07 DIAGNOSIS — E785 Hyperlipidemia, unspecified: Secondary | ICD-10-CM | POA: Insufficient documentation

## 2013-06-07 NOTE — Progress Notes (Signed)
Echocardiogram performed.  

## 2013-06-14 ENCOUNTER — Ambulatory Visit
Admission: RE | Admit: 2013-06-14 | Discharge: 2013-06-14 | Disposition: A | Discharge status: Home / Self Care | Payer: Medicare Other | Source: Ambulatory Visit | Attending: Internal Medicine | Admitting: Internal Medicine

## 2013-06-14 DIAGNOSIS — F0390 Unspecified dementia without behavioral disturbance: Secondary | ICD-10-CM

## 2013-06-14 DIAGNOSIS — I10 Essential (primary) hypertension: Secondary | ICD-10-CM

## 2013-06-14 DIAGNOSIS — R4182 Altered mental status, unspecified: Secondary | ICD-10-CM

## 2013-06-14 DIAGNOSIS — G459 Transient cerebral ischemic attack, unspecified: Secondary | ICD-10-CM

## 2013-06-21 ENCOUNTER — Ambulatory Visit: Payer: Self-pay | Admitting: Neurology

## 2013-07-13 ENCOUNTER — Other Ambulatory Visit: Payer: Self-pay | Admitting: *Deleted

## 2013-07-13 ENCOUNTER — Ambulatory Visit: Payer: Medicare Other | Admitting: Internal Medicine

## 2013-07-13 MED ORDER — GLIPIZIDE 5 MG PO TABS: 10.0000 mg | ORAL_TABLET | Freq: Two times a day (BID) | ORAL | Status: DC

## 2013-08-10 ENCOUNTER — Telehealth: Payer: Self-pay

## 2013-08-10 NOTE — Telephone Encounter (Signed)
Typically, [facility] will contact me for any orders felt necessary.  If continued problems or other concerns, please feel free to schedule appointment with me -but otherwise, followup for this not specifically needed

## 2013-08-10 NOTE — Telephone Encounter (Signed)
Patient son called lmovm 4:44pm 08/09/13 c/o blood in urine that morning. U/A was checked by Hassel Neth by RN and son would like to know if follow up is needed with PCP?

## 2013-08-10 NOTE — Telephone Encounter (Signed)
Returned call x 2 no personal VM, did not leave message. Closing phone note until someone calls back

## 2013-09-24 ENCOUNTER — Telehealth: Payer: Self-pay | Admitting: Internal Medicine

## 2013-09-24 ENCOUNTER — Ambulatory Visit (INDEPENDENT_AMBULATORY_CARE_PROVIDER_SITE_OTHER): Payer: Medicare Other | Admitting: *Deleted

## 2013-09-24 DIAGNOSIS — Z23 Encounter for immunization: Secondary | ICD-10-CM

## 2013-09-24 MED ORDER — INSULIN NPH ISOPHANE & REGULAR (70-30) 100 UNIT/ML ~~LOC~~ SUSP: SUBCUTANEOUS | Status: DC

## 2013-09-24 NOTE — Telephone Encounter (Signed)
Noted concerns -  At the last visit, we covered several issues more pressing than DM It is now time for DM to be checked again - we will do so at next visit I will be happy to help pt establish with a different PCP if this is their preference thanks

## 2013-09-24 NOTE — Telephone Encounter (Signed)
Called pt son [name]) with md response. Pt son was very nasty. He states when he saw md assistant last time was told to stop taking the insulin. If he wouldn't have sent a msg to Dr. Felicity Coyer they wouldn't have known about this. Was asking several questions about why md didn't have diabetes check. Inform pt he will have to address all concerning to md at appt. Transferred to [place] to make f/u, but he hung up...lmb

## 2013-09-24 NOTE — Telephone Encounter (Signed)
Insulin was placed on hold June 2014 because of reported hypoglycemia events and concerns about patient's ability to draw up proper dosing. It was not been resumed at last office visit in July 2014 We will need an A1c to determine how glycemic control has been in the past 3 months -please schedule office visit to arrange same

## 2013-09-24 NOTE — Telephone Encounter (Signed)
Please follow up with when Carol Santiago was suppose to end insulin.

## 2013-11-15 ENCOUNTER — Emergency Department (HOSPITAL_COMMUNITY): Payer: Medicare Other

## 2013-11-15 ENCOUNTER — Encounter (HOSPITAL_COMMUNITY): Payer: Self-pay | Admitting: Radiology

## 2013-11-15 ENCOUNTER — Inpatient Hospital Stay (HOSPITAL_COMMUNITY)
Admission: EM | Admit: 2013-11-15 | Discharge: 2013-11-21 | DRG: 194 | Disposition: A | Discharge status: Skilled Nursing Facility | Payer: Medicare Other | Attending: Internal Medicine | Admitting: Internal Medicine

## 2013-11-15 DIAGNOSIS — R509 Fever, unspecified: Secondary | ICD-10-CM

## 2013-11-15 DIAGNOSIS — Z7901 Long term (current) use of anticoagulants: Secondary | ICD-10-CM

## 2013-11-15 DIAGNOSIS — Z8673 Personal history of transient ischemic attack (TIA), and cerebral infarction without residual deficits: Secondary | ICD-10-CM

## 2013-11-15 DIAGNOSIS — E039 Hypothyroidism, unspecified: Secondary | ICD-10-CM

## 2013-11-15 DIAGNOSIS — F0390 Unspecified dementia without behavioral disturbance: Secondary | ICD-10-CM

## 2013-11-15 DIAGNOSIS — J1100 Influenza due to unidentified influenza virus with unspecified type of pneumonia: Secondary | ICD-10-CM

## 2013-11-15 DIAGNOSIS — G459 Transient cerebral ischemic attack, unspecified: Secondary | ICD-10-CM | POA: Diagnosis present

## 2013-11-15 DIAGNOSIS — E1149 Type 2 diabetes mellitus with other diabetic neurological complication: Secondary | ICD-10-CM

## 2013-11-15 DIAGNOSIS — M129 Arthropathy, unspecified: Secondary | ICD-10-CM | POA: Diagnosis present

## 2013-11-15 DIAGNOSIS — E860 Dehydration: Secondary | ICD-10-CM | POA: Diagnosis present

## 2013-11-15 DIAGNOSIS — E876 Hypokalemia: Secondary | ICD-10-CM

## 2013-11-15 DIAGNOSIS — I10 Essential (primary) hypertension: Secondary | ICD-10-CM

## 2013-11-15 DIAGNOSIS — Z9181 History of falling: Secondary | ICD-10-CM

## 2013-11-15 DIAGNOSIS — N390 Urinary tract infection, site not specified: Secondary | ICD-10-CM

## 2013-11-15 DIAGNOSIS — M1990 Unspecified osteoarthritis, unspecified site: Secondary | ICD-10-CM | POA: Diagnosis present

## 2013-11-15 DIAGNOSIS — Z794 Long term (current) use of insulin: Secondary | ICD-10-CM

## 2013-11-15 DIAGNOSIS — R4182 Altered mental status, unspecified: Secondary | ICD-10-CM

## 2013-11-15 DIAGNOSIS — J189 Pneumonia, unspecified organism: Secondary | ICD-10-CM | POA: Diagnosis present

## 2013-11-15 HISTORY — DX: Hypokalemia: E87.6

## 2013-11-15 HISTORY — DX: Influenza due to unidentified influenza virus with unspecified type of pneumonia: J11.00

## 2013-11-15 LAB — URINALYSIS, ROUTINE W REFLEX MICROSCOPIC
Glucose, UA: 100 mg/dL — AB
Ketones, ur: NEGATIVE mg/dL
Leukocytes, UA: NEGATIVE
NITRITE: POSITIVE — AB
Protein, ur: 30 mg/dL — AB
Specific Gravity, Urine: 1.03 (ref 1.005–1.030)
UROBILINOGEN UA: 0.2 mg/dL (ref 0.0–1.0)
pH: 5 (ref 5.0–8.0)

## 2013-11-15 LAB — COMPREHENSIVE METABOLIC PANEL
ALT: 14 U/L (ref 0–35)
AST: 20 U/L (ref 0–37)
Albumin: 3.3 g/dL — ABNORMAL LOW (ref 3.5–5.2)
Alkaline Phosphatase: 48 U/L (ref 39–117)
BILIRUBIN TOTAL: 1.4 mg/dL — AB (ref 0.3–1.2)
BUN: 25 mg/dL — AB (ref 6–23)
CHLORIDE: 102 meq/L (ref 96–112)
CO2: 29 meq/L (ref 19–32)
CREATININE: 0.68 mg/dL (ref 0.50–1.10)
Calcium: 8.4 mg/dL (ref 8.4–10.5)
GFR, EST AFRICAN AMERICAN: 89 mL/min — AB (ref >90)
GFR, EST NON AFRICAN AMERICAN: 77 mL/min — AB (ref >90)
GLUCOSE: 205 mg/dL — AB (ref 70–99)
Potassium: 3.6 mEq/L — ABNORMAL LOW (ref 3.7–5.3)
Sodium: 142 mEq/L (ref 137–147)
Total Protein: 6.6 g/dL (ref 6.0–8.3)

## 2013-11-15 LAB — URINE MICROSCOPIC-ADD ON

## 2013-11-15 LAB — GLUCOSE, CAPILLARY: Glucose-Capillary: 202 mg/dL — ABNORMAL HIGH (ref 70–99)

## 2013-11-15 LAB — CBC
HEMATOCRIT: 34.9 % — AB (ref 36.0–46.0)
HEMOGLOBIN: 12 g/dL (ref 12.0–15.0)
MCH: 33.3 pg (ref 26.0–34.0)
MCHC: 34.4 g/dL (ref 30.0–36.0)
MCV: 96.9 fL (ref 78.0–100.0)
Platelets: 181 10*3/uL (ref 150–400)
RBC: 3.6 MIL/uL — ABNORMAL LOW (ref 3.87–5.11)
RDW: 13.7 % (ref 11.5–15.5)
WBC: 6.8 10*3/uL (ref 4.0–10.5)

## 2013-11-15 LAB — PROTIME-INR
INR: 1 (ref 0.00–1.49)
Prothrombin Time: 13 seconds (ref 11.6–15.2)

## 2013-11-15 LAB — DIFFERENTIAL
BASOS PCT: 0 % (ref 0–1)
Basophils Absolute: 0 10*3/uL (ref 0.0–0.1)
Eosinophils Absolute: 0 10*3/uL (ref 0.0–0.7)
Eosinophils Relative: 0 % (ref 0–5)
LYMPHS ABS: 1 10*3/uL (ref 0.7–4.0)
LYMPHS PCT: 14 % (ref 12–46)
MONO ABS: 0.7 10*3/uL (ref 0.1–1.0)
MONOS PCT: 10 % (ref 3–12)
NEUTROS ABS: 5.2 10*3/uL (ref 1.7–7.7)
Neutrophils Relative %: 76 % (ref 43–77)

## 2013-11-15 LAB — APTT: APTT: 27 s (ref 24–37)

## 2013-11-15 LAB — TROPONIN I: Troponin I: <0.3 ng/mL (ref <0.30)

## 2013-11-15 LAB — POCT I-STAT TROPONIN I: Troponin i, poc: 0 ng/mL (ref 0.00–0.08)

## 2013-11-15 LAB — CG4 I-STAT (LACTIC ACID): Lactic Acid, Venous: 1.24 mmol/L (ref 0.5–2.2)

## 2013-11-15 MED ORDER — PIPERACILLIN-TAZOBACTAM 3.375 G IVPB
3.3750 g | Freq: Three times a day (TID) | INTRAVENOUS | Status: DC
  Administered 2013-11-16 – 2013-11-20 (×14): 3.375 g via INTRAVENOUS
  Filled 2013-11-15 (×16): qty 50

## 2013-11-15 MED ORDER — PIPERACILLIN-TAZOBACTAM 3.375 G IVPB 30 MIN
3.3750 g | INTRAVENOUS | Status: AC
  Administered 2013-11-15: 3.375 g via INTRAVENOUS
  Filled 2013-11-15: qty 50

## 2013-11-15 MED ORDER — VANCOMYCIN HCL IN DEXTROSE 1-5 GM/200ML-% IV SOLN
1000.0000 mg | Freq: Two times a day (BID) | INTRAVENOUS | Status: DC
  Administered 2013-11-16 – 2013-11-17 (×3): 1000 mg via INTRAVENOUS
  Filled 2013-11-15 (×4): qty 200

## 2013-11-15 MED ORDER — ASPIRIN 300 MG RE SUPP: 300.0000 mg | Freq: Every day | RECTAL | Status: DC

## 2013-11-15 MED ORDER — VANCOMYCIN HCL 10 G IV SOLR
1250.0000 mg | Freq: Once | INTRAVENOUS | Status: AC
  Administered 2013-11-15: 1250 mg via INTRAVENOUS
  Filled 2013-11-15: qty 1250

## 2013-11-15 MED ORDER — DEXTROSE 5 % IV SOLN
1.0000 g | Freq: Once | INTRAVENOUS | Status: AC
  Administered 2013-11-15: 1 g via INTRAVENOUS
  Filled 2013-11-15: qty 10

## 2013-11-15 MED ORDER — ACETAMINOPHEN 650 MG RE SUPP
650.0000 mg | Freq: Once | RECTAL | Status: AC
  Administered 2013-11-15: 650 mg via RECTAL
  Filled 2013-11-15: qty 1

## 2013-11-15 MED ORDER — SODIUM CHLORIDE 0.9 % IV SOLN
INTRAVENOUS | Status: AC
  Administered 2013-11-15: 23:00:00 via INTRAVENOUS

## 2013-11-15 NOTE — ED Provider Notes (Signed)
CSN: [id number]     Arrival date & time 11/15/13  1802 History   First MD Initiated Contact with Patient 11/15/13 1805     Chief Complaint  Patient presents with  . Code Stroke    HPI PTAR called out first at 15:30 for a fall. Pt was alert, oreinted and denied pain at that time. At 1700, called back out to home due to to increasing confusion, lethargy and right sided weakness. CBG 188 for EMS. BP 160/92 by EMS. Pt lives at [facility]  Past Medical History  Diagnosis Date  . Diabetes mellitus type 2 with neurological manifestations   . Hypertension   . Dementia   . Arthritis   . Hypothyroid   . TIA (transient ischemic attack)   . Osteoarthritis    Past Surgical History  Procedure Laterality Date  . Tonsillectomy  1938  . Left knee surgery  2003   Family History  Problem Relation Age of Onset  . Arthritis Mother   . Stroke Mother   . Hypertension Mother   . Colon cancer Father   . Lung cancer Brother   . Kidney cancer Brother   . Diabetes Maternal Aunt    History  Substance Use Topics  . Smoking status: Never Smoker   . Smokeless tobacco: Not on file  . Alcohol Use: No   OB History   Grav Para Term Preterm Abortions TAB SAB Ect Mult Living                 Review of Systems Level V caveat Allergies  Review of patient's allergies indicates no known allergies.  Home Medications   Current Outpatient Rx  Name  Route  Sig  Dispense  Refill  . aspirin 81 MG tablet   Oral   Take 81 mg by mouth daily.         . cefUROXime (CEFTIN) 250 MG tablet   Oral   Take 1 tablet (250 mg total) by mouth 2 (two) times daily.   14 tablet   0   . clopidogrel (PLAVIX) 75 MG tablet   Oral   Take 1 tablet (75 mg total) by mouth daily.   90 tablet   3   . donepezil (ARICEPT) 5 MG tablet   Oral   Take 1 tablet (5 mg total) by mouth at bedtime as needed.   30 tablet   3   . glipiZIDE (GLUCOTROL) 5 MG tablet   Oral   Take 2 tablets (10 mg total) by mouth 2 (two)  times daily before a meal.   60 tablet   1   . insulin NPH-regular (NOVOLIN 70/30) (70-30) 100 UNIT/ML injection      On hold until further notice   10 mL      . levothyroxine (SYNTHROID, LEVOTHROID) 75 MCG tablet      TAKE ONE TABLET BY MOUTH EVERY DAY   90 tablet   2   . lisinopril-hydrochlorothiazide (PRINZIDE,ZESTORETIC) 10-12.5 MG per tablet   Oral   Take 1 tablet by mouth daily.   90 tablet   3   . pravastatin (PRAVACHOL) 20 MG tablet   Oral   Take 1 tablet (20 mg total) by mouth daily.   90 tablet   3   . sulindac (CLINORIL) 200 MG tablet   Oral   Take 200 mg by mouth 2 (two) times daily.          BP 125/45  Pulse 64  Temp(Src) 102.1  F (38.9 C) (Rectal)  Resp 17  Wt 181 lb 10.5 oz (82.4 kg)  SpO2 97% Physical Exam  Nursing note and vitals reviewed. Constitutional: She appears well-developed and well-nourished. She appears lethargic. No distress.  HENT:  Head: Normocephalic and atraumatic.  Eyes: Pupils are equal, round, and reactive to light.  Neck: Normal range of motion.  Cardiovascular: Normal rate and intact distal pulses.   Pulmonary/Chest: No respiratory distress.  Abdominal: Normal appearance. She exhibits no distension.  Musculoskeletal: Normal range of motion.  Neurological: She has normal strength. She appears lethargic. No cranial nerve deficit. GCS eye subscore is 4. GCS verbal subscore is 3. GCS motor subscore is 5.  Skin: Skin is warm and dry. No rash noted.    ED Course  Procedures (including critical care time)  Code stroke initially called.  Neurology evaluated patient in emergency department.  No immediate treatment indicated. Labs Review Labs Reviewed  CBC - Abnormal; Notable for the following:    RBC 3.60 (*)    HCT 34.9 (*)    All other components within normal limits  COMPREHENSIVE METABOLIC PANEL - Abnormal; Notable for the following:    Potassium 3.6 (*)    Glucose, Bld 205 (*)    BUN 25 (*)    Albumin 3.3 (*)     Total Bilirubin 1.4 (*)    GFR calc non Af Amer 77 (*)    GFR calc Af Amer 89 (*)    All other components within normal limits  GLUCOSE, CAPILLARY - Abnormal; Notable for the following:    Glucose-Capillary 202 (*)    All other components within normal limits  URINALYSIS, ROUTINE W REFLEX MICROSCOPIC - Abnormal; Notable for the following:    Color, Urine AMBER (*)    APPearance TURBID (*)    Glucose, UA 100 (*)    Hgb urine dipstick SMALL (*)    Bilirubin Urine SMALL (*)    Protein, ur 30 (*)    Nitrite POSITIVE (*)    All other components within normal limits  URINE MICROSCOPIC-ADD ON - Abnormal; Notable for the following:    Squamous Epithelial / LPF FEW (*)    All other components within normal limits  PROTIME-INR  APTT  DIFFERENTIAL  TROPONIN I  HEMOGLOBIN A1C  POCT I-STAT TROPONIN I  CG4 I-STAT (LACTIC ACID)   Imaging Review Dg Chest 2 View  11/15/2013   CLINICAL DATA:  Code stroke.  Weakness and confusion.  EXAM: CHEST  2 VIEW  COMPARISON:  No priors.  FINDINGS: Bibasilar opacities (left greater than right) may reflect areas of atelectasis and/or consolidation. Probable trace left pleural effusion. Moderate venous congestion, without frank pulmonary edema. Mild cardiomegaly. Upper mediastinal contours are within normal limits. Atherosclerosis in the thoracic aorta.  IMPRESSION: 1. Bibasilar opacities (left greater than right) may reflect areas of atelectasis and/or consolidation. In the setting of suspected stroke, sequela of aspiration is not excluded. 2. Trace left pleural effusion. 3. Cardiomegaly with pulmonary venous congestion. 4. Atherosclerosis.   Electronically Signed   By: Trudie Reed M.D.   On: 11/15/2013 20:15   Ct Head (brain) Wo Contrast  11/15/2013   CLINICAL DATA:  Right side weakness, right facial droop, confusion  EXAM: CT HEAD WITHOUT CONTRAST  TECHNIQUE: Contiguous axial images were obtained from the base of the skull through the vertex without intravenous  contrast.  COMPARISON:  5/22/13CT head; 06/14/13 MRI  FINDINGS: No hemorrhage or extra-axial fluid. No hydrocephalus.There is low attenuation in the left  frontal lobe subcortical white matter, which is expanded when compared to Apr 05, 2012 but stable when compared to June 14, 2013. There are no other foci of abnormal attenuation to suggest acute infarction. Areas of chronic lacunar infarction bilateral cerebellar hemispheres stable.  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 No acute intracranial findings. Critical Value/emergent results were called by telephone at the time of interpretation on 11/15/2013 at 6:35 PM to Dr. Cyril Mourningamillo, who verbally acknowledged these results.   Electronically Signed   By: Esperanza Heiraymond  Rubner M.D.   On: 11/15/2013 18:35    EKG Interpretation   None      Medications  cefTRIAXone (ROCEPHIN) 1 g in dextrose 5 % 50 mL IVPB (1 g Intravenous New Bag/Given 11/15/13 2106)  acetaminophen (TYLENOL) suppository 650 mg (650 mg Rectal Given 11/15/13 1919)    MDM   1. Fever   2. Mental status change   3. UTI (lower urinary tract infection)        Nelia Shiobert L Jericho Alcorn, MD 11/15/13 2132

## 2013-11-15 NOTE — Consult Note (Addendum)
Referring Physician: ED    Chief Complaint: altered mental status, right face weakness, right sided weakness.  HPI:                                                                                                                                         Syrianna Schillaci is an 80 y.o. female with a past medical history significant for HTN, DM, TIA, hypothyroidism,  OA, brought to [facility] ED by ambulance as a code stroke due to the above stated symptoms. The story is rather confusing and last known well can not be established with certainty , as EMS report that they went to her assisted living facility twice and at the last call she was found unresponsive in the floor and they noticed that she had right sided weakness and right face weakness. By the time she arrived to the ED she was able to answer some questions and had NIHSS 4. CT brain revealed no acute abnormality. At this moment she denies HA, vertigo, double vision, focal weakness or numbness, slurred speech, language or vision impairment.   Date last known well: 11/15/13 Time last known well: uncertain tPA Given: no, unknown last seen normal NIHSS: 4   Past Medical History  Diagnosis Date  . Diabetes mellitus type 2 with neurological manifestations   . Hypertension   . Dementia   . Arthritis   . Hypothyroid   . TIA (transient ischemic attack)   . Osteoarthritis     Past Surgical History  Procedure Laterality Date  . Tonsillectomy  1938  . Left knee surgery  2003    Family History  Problem Relation Age of Onset  . Arthritis Mother   . Stroke Mother   . Hypertension Mother   . Colon cancer Father   . Lung cancer Brother   . Kidney cancer Brother   . Diabetes Maternal Aunt    Social History:  reports that she has never smoked. She does not have any smokeless tobacco history on file. She reports that she does not drink alcohol or use illicit drugs.  Allergies: No Known Allergies  Medications:                                                                                                                            I have reviewed the patient's current medications.  ROS:  Unable  to obtain due to mental status                                                                                                History obtained from chart review     Physical exam: pleasant fele in no apparent distress.Blood pressure 149/47, pulse 72, temperature 99 F (37.2 C), temperature source Oral, resp. rate 16, weight 82.4 kg (181 lb 10.5 oz), SpO2 92.00%.  Head: normocephalic. Neck: supple, no bruits, no JVD. Cardiac: no murmurs. Lungs: clear. Abdomen: soft, no tender, no mass. Extremities: no edema.  Neurologic Examination:                                                                                                      Mental Status: Alert, awake, oriented to place. Speech fluent without evidence of aphasia.  Able to follow 3 step commands without difficulty. Cranial Nerves: II: Discs flat bilaterally; Visual fields grossly normal, pupils equal, round, reactive to light and accommodation III,IV, VI: ptosis not present, extra-ocular motions intact bilaterally V,VII: smile symmetric, facial light touch sensation normal bilaterally VIII: hearing normal bilaterally IX,X: gag reflex present XI: bilateral shoulder shrug XII: midline tongue extension without atrophy or fasciculations  Motor: Right : Upper extremity   5/5    Left:     Upper extremity   5/5  Lower extremity   5/5     Lower extremity   5/5 Tone and bulk:normal tone throughout; no atrophy noted Sensory: Pinprick and light touch intact throughout, bilaterally Deep Tendon Reflexes:  Right: Upper Extremity   Left: Upper extremity   biceps (C-5 to C-6) 2/4   biceps (C-5 to C-6) 2/4 tricep (C7) 2/4    triceps (C7) 2/4 Brachioradialis (C6) 2/4  Brachioradialis (C6) 2/4  Lower Extremity Lower Extremity  quadriceps (L-2 to L-4) 2/4   quadriceps (L-2 to L-4)  2/4 Achilles (S1) 2/4   Achilles (S1) 2/4  Plantars: Right: downgoing   Left: downgoing Cerebellar: normal finger-to-nose,  normal heel-to-shin test in the right Gait:  Unable to test CV: pulses palpable throughout    No results found for this or any previous visit (from the past 48 hour(s)). Ct Head (brain) Wo Contrast  11/15/2013   CLINICAL DATA:  Right side weakness, right facial droop, confusion  EXAM: CT HEAD WITHOUT CONTRAST  TECHNIQUE: Contiguous axial images were obtained from the base of the skull through the vertex without intravenous contrast.  COMPARISON:  5/22/13CT head; 06/14/13 MRI  FINDINGS: No hemorrhage or extra-axial fluid. No hydrocephalus.There is low attenuation in the left frontal lobe subcortical white matter, which is expanded when compared to Apr 05, 2012 but stable when compared to June 14, 2013. There are no other foci of abnormal attenuation to suggest acute infarction. Areas of chronic lacunar infarction bilateral cerebellar hemispheres stable.  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 No acute intracranial findings. Critical Value/emergent results were called by telephone at the time of interpretation on 11/15/2013 at 6:35 PM to Dr. Cyril Mourning, who verbally acknowledged these results.   Electronically Signed   By: Esperanza Heir M.D.   On: 11/15/2013 18:35     Assessment: 80 y.o. female brought in due to altered mental status, right face weakness, right sided weakness. Initial NIHSS 4 but improving in the ED. Last seen normal is unclear and thus will not offer TPA. A left brain stroke is possible. Will admit to medicine and complete stroke work up.   Stroke Risk Factors - age, HTN, DM, TIA,  Plan: 1. HgbA1c, fasting lipid panel 2. MRI, MRA  of the brain without contrast 3. Echocardiogram 4. Carotid dopplers 5. Prophylactic therapy-Antiplatelet med: Aspirin 81 MG DAILY 6. Risk factor  modification 7. Telemetry monitoring 8. Frequent neuro checks 9. PT/OT SLP  Wyatt Portela, MD [facility].  Patient found to be septic. Will cancel stroke work up.

## 2013-11-15 NOTE — ED Notes (Addendum)
Pt taken to xray 

## 2013-11-15 NOTE — H&P (Signed)
[facility] History and Physical  Patient: Carol Santiago  ZOX:[id number]  DOB: 1925/10/02  DOS: the patient was seen and examined on 11/15/2013 PCP: Rene Paci, MD  Chief Complaint: Altered mental status  HPI: Carol Santiago is a 80 y.o. female with Past medical history of diabetes mellitus, dementia, hypertension, hypothyroidism, TIA. The patient is coming from ALF. The history has been primarily obtained from the son on the phone as the patient appears poor historian due to her dementia and change in mental status. At around noon son tried to called her, but Phone kept on rang and she didn't answer. At around 3 pm son went to see her and he found her Flat on the back on the carpeted floor next to the bed. Son told me that she has H/o falls. And he thinks that she might be probably for 3 hours on the floor. There was no incontinence noted.but he saw that she had Dry mouth and thought she was dehydrated. He could not get her up on his own. And called EMS, since her symptoms were more concerning it did not brought her to the ED at that time. After the EMS went back some found that the patient was Not responding to questions appropriately and was not following command appropriately.and therefore he called EMS again around  5pm.And brought her to the hospital . as per the son the patient was at her baseline yesterday was walking around and eating her meals on her own there was no fever chills nausea vomiting diarrhea burning urination, or fall noted.  Review of Systems: as mentioned in the history of present illness.  A Comprehensive review of the other systems is negative.  Past Medical History  Diagnosis Date  . Diabetes mellitus type 2 with neurological manifestations   . Hypertension   . Dementia   . Arthritis   . Hypothyroid   . TIA (transient ischemic attack)   . Osteoarthritis    Past Surgical History  Procedure Laterality Date  . Tonsillectomy  1938  . Left knee  surgery  2003   Social History:  reports that she has never smoked. She does not have any smokeless tobacco history on file. She reports that she does not drink alcohol or use illicit drugs.  partial Independent for most of her  ADL.  No Known Allergies  Family History  Problem Relation Age of Onset  . Arthritis Mother   . Stroke Mother   . Hypertension Mother   . Colon cancer Father   . Lung cancer Brother   . Kidney cancer Brother   . Diabetes Maternal Aunt     Prior to Admission medications   Medication Sig Start Date End Date Taking? Authorizing Provider  aspirin 81 MG tablet Take 81 mg by mouth daily.    Historical Provider, MD  cefUROXime (CEFTIN) 250 MG tablet Take 1 tablet (250 mg total) by mouth 2 (two) times daily. 05/29/13   Newt Lukes, MD  clopidogrel (PLAVIX) 75 MG tablet Take 1 tablet (75 mg total) by mouth daily. 05/28/13   Newt Lukes, MD  donepezil (ARICEPT) 5 MG tablet Take 1 tablet (5 mg total) by mouth at bedtime as needed. 02/02/13   Newt Lukes, MD  glipiZIDE (GLUCOTROL) 5 MG tablet Take 2 tablets (10 mg total) by mouth 2 (two) times daily before a meal. 07/13/13   Newt Lukes, MD  insulin NPH-regular (NOVOLIN 70/30) (70-30) 100 UNIT/ML injection On hold until further notice 09/24/13  Newt LukesValerie A Leschber, MD  levothyroxine (SYNTHROID, LEVOTHROID) 75 MCG tablet TAKE ONE TABLET BY MOUTH EVERY DAY 02/24/13   York Spanielharles K Willis, MD  lisinopril-hydrochlorothiazide (PRINZIDE,ZESTORETIC) 10-12.5 MG per tablet Take 1 tablet by mouth daily. 05/28/13   Newt LukesValerie A Leschber, MD  pravastatin (PRAVACHOL) 20 MG tablet Take 1 tablet (20 mg total) by mouth daily. 05/29/13   Newt LukesValerie A Leschber, MD  sulindac (CLINORIL) 200 MG tablet Take 200 mg by mouth 2 (two) times daily.    Historical Provider, MD    Physical Exam: Filed Vitals:   11/15/13 2015 11/15/13 2100 11/15/13 2130 11/15/13 2200  BP: 136/41 125/45 124/50 128/55  Pulse: 65 64 60 67  Temp:       TempSrc:      Resp: 19 17 17 12   Weight:      SpO2: 95% 97% 97% 98%    General: Alert, Awake and disoriented to Time, Place and Person. Appear in moderate distress Eyes: PERRL ENT: Oral Mucosa clear dry. Neck: no JVD Cardiovascular: S1 and S2 Present, no Murmur, Peripheral Pulses Present Respiratory: Bilateral Air entry equal and Decreased, basal Crackles, no wheezes Abdomen: Bowel Sound Present, Soft and Non tender Skin:  no Rash Extremities:  no Pedal edema,  no calf tenderness Neurologic:  other than disorientation Grossly Unremarkable.  Labs on Admission:  CBC:  Recent Labs Lab 11/15/13 1828  WBC 6.8  NEUTROABS 5.2  HGB 12.0  HCT 34.9*  MCV 96.9  PLT 181    CMP     Component Value Date/Time   NA 142 11/15/2013 1828   NA 140 03/02/2012   K 3.6* 11/15/2013 1828   CL 102 11/15/2013 1828   CO2 29 11/15/2013 1828   GLUCOSE 205* 11/15/2013 1828   BUN 25* 11/15/2013 1828   BUN 23* 03/02/2012   CREATININE 0.68 11/15/2013 1828   CREATININE 0.7 03/02/2012   CALCIUM 8.4 11/15/2013 1828   PROT 6.6 11/15/2013 1828   ALBUMIN 3.3* 11/15/2013 1828   AST 20 11/15/2013 1828   ALT 14 11/15/2013 1828   ALKPHOS 48 11/15/2013 1828   BILITOT 1.4* 11/15/2013 1828   GFRNONAA 77* 11/15/2013 1828   GFRAA 89* 11/15/2013 1828    No results found for this basename: LIPASE, AMYLASE,  in the last 168 hours No results found for this basename: AMMONIA,  in the last 168 hours   Recent Labs Lab 11/15/13 1828  TROPONINI <0.30   BNP (last 3 results) No results found for this basename: PROBNP,  in the last 8760 hours  Radiological Exams on Admission: Dg Chest 2 View  11/15/2013   CLINICAL DATA:  Code stroke.  Weakness and confusion.  EXAM: CHEST  2 VIEW  COMPARISON:  No priors.  FINDINGS: Bibasilar opacities (left greater than right) may reflect areas of atelectasis and/or consolidation. Probable trace left pleural effusion. Moderate venous congestion, without frank pulmonary edema. Mild cardiomegaly. Upper  mediastinal contours are within normal limits. Atherosclerosis in the thoracic aorta.  IMPRESSION: 1. Bibasilar opacities (left greater than right) may reflect areas of atelectasis and/or consolidation. In the setting of suspected stroke, sequela of aspiration is not excluded. 2. Trace left pleural effusion. 3. Cardiomegaly with pulmonary venous congestion. 4. Atherosclerosis.   Electronically Signed   By: Trudie Reedaniel  Entrikin M.D.   On: 11/15/2013 20:15   Ct Head (brain) Wo Contrast  11/15/2013   CLINICAL DATA:  Right side weakness, right facial droop, confusion  EXAM: CT HEAD WITHOUT CONTRAST  TECHNIQUE: Contiguous axial images were  obtained from the base of the skull through the vertex without intravenous contrast.  COMPARISON:  5/22/13CT head; 06/14/13 MRI  FINDINGS: No hemorrhage or extra-axial fluid. No hydrocephalus.There is low attenuation in the left frontal lobe subcortical white matter, which is expanded when compared to Apr 05, 2012 but stable when compared to June 14, 2013. There are no other foci of abnormal attenuation to suggest acute infarction. Areas of chronic lacunar infarction bilateral cerebellar hemispheres stable.  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 No acute intracranial findings. Critical Value/emergent results were called by telephone at the time of interpretation on 11/15/2013 at 6:35 PM to Dr. Cyril Mourning, who verbally acknowledged these results.   Electronically Signed   By: Esperanza Heir M.D.   On: 11/15/2013 18:35    EKG: Independently reviewed. normal sinus rhythm, nonspecific ST and T waves changes.  Assessment/Plan Active Problems:   TIA (transient ischemic attack)   Hypothyroid   Dementia   Hypertension   Diabetes mellitus type 2 with neurological manifestations   HCAP (healthcare-associated pneumonia)   1. HCAP (healthcare-associated pneumonia)  the patient presents with episode of confusion and a fall.  It is unclear how long she was found on the floor. She did have a cough on my evaluation as well as basal crackles. She was also found to be having febrile and her chest x-ray did show possible aspiration. She is from an assisted living facility. Therefore she will be treated for healthcare associated pneumonia suspected. She will be given IV antibiotics vancomycin and Zosyn. Blood cultures are obtained. Urine antigens will be obtained as well as influenza PCR. I would continue her on IV fluid for resuscitation.  2.Possible TIA versus stroke She has an initial CT scan which was negative. I would continue frequent neuro checks and follow neurology recommendation for further workup. At present I have canceled the cord stroke. I will consult PTOT and speech therapy and keep the patient n.p.o.  3. Diabetes Sliding scale  4. Hypertension Holding her medications at present   Consults:  neurology  DVT Prophylaxis: subcutaneous Heparin Nutrition:  n.p.o.  Code Status: . As per my discussion with son full Family Communication:  discussed with son  opportunity was given to ask question and all questions were answered satisfactorily at the time of interview. Disposition: Admitted to inpatient in telemetry unit.  Author: Lynden Oxford, MD [facility] Pager: [phone] 11/15/2013, 10:19 PM    If 7PM-7AM, please contact night-coverage [email or web address] Password [email or web address]

## 2013-11-15 NOTE — ED Notes (Addendum)
PTAR called out first at 15:30 for a fall. Pt was alert, oreinted and denied pain at that time. At 1700, called back out to home due to to increasing confusion, lethargy and right sided weakness.  CBG 188 for EMS. BP 160/92 by EMS. Pt lives at [facility]

## 2013-11-15 NOTE — Progress Notes (Signed)
ANTIBIOTIC CONSULT NOTE - INITIAL  Pharmacy Consult for Vancomycin & Zosyn Indication: rule out pneumonia  No Known Allergies  Patient Measurements: Weight: 181 lb 10.5 oz (82.4 kg) Vital Signs: Temp: 102.1 F (38.9 C) (01/01 1904) Temp src: Rectal (01/01 1904) BP: 128/55 mmHg (01/01 2200) Pulse Rate: 67 (01/01 2200) Labs:  Recent Labs  11/15/13 1828  WBC 6.8  HGB 12.0  PLT 181  CREATININE 0.68   The CrCl is unknown because both a height and weight (above a minimum accepted value) are required for this calculation.  Microbiology: No results found for this or any previous visit (from the past 720 hour(s)).  Medical History: Past Medical History  Diagnosis Date  . Diabetes mellitus type 2 with neurological manifestations   . Hypertension   . Dementia   . Arthritis   . Hypothyroid   . TIA (transient ischemic attack)   . Osteoarthritis    Medications:  Received Rocephin 1g at 2106.  No other antibiotics given.   Assessment: [age] YOF admitted after being found down at her independent senior living home with a fever to start vancomycin and Zosyn for pneumonia. SCr 0.68/estCrCl~ 77.25 mL/min. WBC is wnl. Tmax 102.1.   Goal of Therapy:  Vancomycin trough level 15-20 mcg/ml  Plan:  1. Zosyn 3.375g IV q8h- 1st dose of 30min, remainder over 4 hours.  2. Vancomycin 1250mg  IV now, then 1g. IV q12h.   Link SnufferJessica Ishita Mcnerney, PharmD, BCPS Clinical Pharmacist [phone] 11/15/2013,10:29 PM

## 2013-11-16 ENCOUNTER — Encounter (HOSPITAL_COMMUNITY): Payer: Self-pay | Admitting: General Practice

## 2013-11-16 DIAGNOSIS — R4182 Altered mental status, unspecified: Secondary | ICD-10-CM

## 2013-11-16 DIAGNOSIS — J189 Pneumonia, unspecified organism: Secondary | ICD-10-CM | POA: Diagnosis present

## 2013-11-16 DIAGNOSIS — E1149 Type 2 diabetes mellitus with other diabetic neurological complication: Secondary | ICD-10-CM

## 2013-11-16 DIAGNOSIS — J1100 Influenza due to unidentified influenza virus with unspecified type of pneumonia: Principal | ICD-10-CM

## 2013-11-16 DIAGNOSIS — R509 Fever, unspecified: Secondary | ICD-10-CM

## 2013-11-16 DIAGNOSIS — E1142 Type 2 diabetes mellitus with diabetic polyneuropathy: Secondary | ICD-10-CM

## 2013-11-16 LAB — COMPREHENSIVE METABOLIC PANEL
ALT: 12 U/L (ref 0–35)
AST: 19 U/L (ref 0–37)
Albumin: 2.7 g/dL — ABNORMAL LOW (ref 3.5–5.2)
Alkaline Phosphatase: 42 U/L (ref 39–117)
BILIRUBIN TOTAL: 1.1 mg/dL (ref 0.3–1.2)
BUN: 24 mg/dL — ABNORMAL HIGH (ref 6–23)
CALCIUM: 7.9 mg/dL — AB (ref 8.4–10.5)
CHLORIDE: 102 meq/L (ref 96–112)
CO2: 28 mEq/L (ref 19–32)
Creatinine, Ser: 0.71 mg/dL (ref 0.50–1.10)
GFR, EST AFRICAN AMERICAN: 88 mL/min — AB (ref >90)
GFR, EST NON AFRICAN AMERICAN: 76 mL/min — AB (ref >90)
GLUCOSE: 151 mg/dL — AB (ref 70–99)
Potassium: 3.2 mEq/L — ABNORMAL LOW (ref 3.7–5.3)
Sodium: 141 mEq/L (ref 137–147)
Total Protein: 5.8 g/dL — ABNORMAL LOW (ref 6.0–8.3)

## 2013-11-16 LAB — LEGIONELLA ANTIGEN, URINE: LEGIONELLA ANTIGEN, URINE: NEGATIVE

## 2013-11-16 LAB — CBC WITH DIFFERENTIAL/PLATELET
BASOS ABS: 0 10*3/uL (ref 0.0–0.1)
BASOS PCT: 0 % (ref 0–1)
EOS ABS: 0 10*3/uL (ref 0.0–0.7)
Eosinophils Relative: 0 % (ref 0–5)
HCT: 32.4 % — ABNORMAL LOW (ref 36.0–46.0)
HEMOGLOBIN: 10.7 g/dL — AB (ref 12.0–15.0)
Lymphocytes Relative: 18 % (ref 12–46)
Lymphs Abs: 1.1 10*3/uL (ref 0.7–4.0)
MCH: 32.9 pg (ref 26.0–34.0)
MCHC: 33 g/dL (ref 30.0–36.0)
MCV: 99.7 fL (ref 78.0–100.0)
Monocytes Absolute: 0.8 10*3/uL (ref 0.1–1.0)
Monocytes Relative: 13 % — ABNORMAL HIGH (ref 3–12)
NEUTROS PCT: 69 % (ref 43–77)
Neutro Abs: 4.1 10*3/uL (ref 1.7–7.7)
Platelets: 147 10*3/uL — ABNORMAL LOW (ref 150–400)
RBC: 3.25 MIL/uL — ABNORMAL LOW (ref 3.87–5.11)
RDW: 14.1 % (ref 11.5–15.5)
WBC: 6 10*3/uL (ref 4.0–10.5)

## 2013-11-16 LAB — GLUCOSE, CAPILLARY
GLUCOSE-CAPILLARY: 190 mg/dL — AB (ref 70–99)
GLUCOSE-CAPILLARY: 176 mg/dL — AB (ref 70–99)
Glucose-Capillary: 136 mg/dL — ABNORMAL HIGH (ref 70–99)
Glucose-Capillary: 151 mg/dL — ABNORMAL HIGH (ref 70–99)
Glucose-Capillary: 130 mg/dL — ABNORMAL HIGH (ref 70–99)

## 2013-11-16 LAB — HEMOGLOBIN A1C
Hgb A1c MFr Bld: 7.8 % — ABNORMAL HIGH
Mean Plasma Glucose: 177 mg/dL — ABNORMAL HIGH

## 2013-11-16 LAB — INFLUENZA PANEL BY PCR (TYPE A & B)
H1N1 flu by pcr: NOT DETECTED
INFLBPCR: NEGATIVE
Influenza A By PCR: POSITIVE — AB

## 2013-11-16 LAB — CK: Total CK: 258 U/L — ABNORMAL HIGH (ref 7–177)

## 2013-11-16 LAB — STREP PNEUMONIAE URINARY ANTIGEN: Strep Pneumo Urinary Antigen: NEGATIVE

## 2013-11-16 LAB — TSH: TSH: 1.388 u[IU]/mL (ref 0.350–4.500)

## 2013-11-16 MED ORDER — POTASSIUM CHLORIDE CRYS ER 20 MEQ PO TBCR
40.0000 meq | EXTENDED_RELEASE_TABLET | Freq: Once | ORAL | Status: AC
  Administered 2013-11-16: 40 meq via ORAL
  Filled 2013-11-16: qty 2

## 2013-11-16 MED ORDER — DONEPEZIL HCL 5 MG PO TABS
5.0000 mg | ORAL_TABLET | Freq: Every day | ORAL | Status: DC
  Administered 2013-11-16 – 2013-11-20 (×5): 5 mg via ORAL
  Filled 2013-11-16 (×7): qty 1

## 2013-11-16 MED ORDER — CLOPIDOGREL BISULFATE 75 MG PO TABS
75.0000 mg | ORAL_TABLET | Freq: Every day | ORAL | Status: DC
  Administered 2013-11-16 – 2013-11-21 (×6): 75 mg via ORAL
  Filled 2013-11-16 (×8): qty 1

## 2013-11-16 MED ORDER — SODIUM CHLORIDE 0.9 % IV SOLN
INTRAVENOUS | Status: AC
  Administered 2013-11-16: 03:00:00 via INTRAVENOUS

## 2013-11-16 MED ORDER — INSULIN ASPART 100 UNIT/ML ~~LOC~~ SOLN
0.0000 [IU] | Freq: Every day | SUBCUTANEOUS | Status: DC
  Administered 2013-11-18: 2 [IU] via SUBCUTANEOUS

## 2013-11-16 MED ORDER — OSELTAMIVIR PHOSPHATE 75 MG PO CAPS
75.0000 mg | ORAL_CAPSULE | Freq: Two times a day (BID) | ORAL | Status: DC
  Filled 2013-11-16 (×2): qty 1

## 2013-11-16 MED ORDER — HEPARIN SODIUM (PORCINE) 5000 UNIT/ML IJ SOLN
5000.0000 [IU] | Freq: Three times a day (TID) | INTRAMUSCULAR | Status: DC
  Administered 2013-11-16 – 2013-11-21 (×16): 5000 [IU] via SUBCUTANEOUS
  Filled 2013-11-16 (×18): qty 1

## 2013-11-16 MED ORDER — ASPIRIN EC 81 MG PO TBEC
81.0000 mg | DELAYED_RELEASE_TABLET | Freq: Every day | ORAL | Status: DC
  Administered 2013-11-16 – 2013-11-21 (×6): 81 mg via ORAL
  Filled 2013-11-16 (×7): qty 1

## 2013-11-16 MED ORDER — INSULIN ASPART 100 UNIT/ML ~~LOC~~ SOLN
0.0000 [IU] | Freq: Three times a day (TID) | SUBCUTANEOUS | Status: DC
  Administered 2013-11-16: 2 [IU] via SUBCUTANEOUS
  Administered 2013-11-16: 3 [IU] via SUBCUTANEOUS
  Administered 2013-11-17: 5 [IU] via SUBCUTANEOUS
  Administered 2013-11-17 (×2): 3 [IU] via SUBCUTANEOUS
  Administered 2013-11-18: 18:00:00 via SUBCUTANEOUS
  Administered 2013-11-18: 3 [IU] via SUBCUTANEOUS
  Administered 2013-11-18: 08:00:00 via SUBCUTANEOUS
  Administered 2013-11-19: 3 [IU] via SUBCUTANEOUS
  Administered 2013-11-19 (×2): 5 [IU] via SUBCUTANEOUS
  Administered 2013-11-20 (×2): 3 [IU] via SUBCUTANEOUS
  Administered 2013-11-20: 2 [IU] via SUBCUTANEOUS
  Administered 2013-11-21: 3 [IU] via SUBCUTANEOUS

## 2013-11-16 MED ORDER — CEPASTAT 14.5 MG MT LOZG
1.0000 | LOZENGE | OROMUCOSAL | Status: DC | PRN
  Filled 2013-11-16: qty 18

## 2013-11-16 MED ORDER — LEVOTHYROXINE SODIUM 75 MCG PO TABS
75.0000 ug | ORAL_TABLET | Freq: Every day | ORAL | Status: DC
  Administered 2013-11-16 – 2013-11-21 (×6): 75 ug via ORAL
  Filled 2013-11-16 (×8): qty 1

## 2013-11-16 MED ORDER — OSELTAMIVIR PHOSPHATE 30 MG PO CAPS
30.0000 mg | ORAL_CAPSULE | Freq: Two times a day (BID) | ORAL | Status: AC
  Administered 2013-11-16 – 2013-11-20 (×10): 30 mg via ORAL
  Filled 2013-11-16 (×12): qty 1

## 2013-11-16 MED ORDER — SIMVASTATIN 40 MG PO TABS
40.0000 mg | ORAL_TABLET | Freq: Every day | ORAL | Status: DC
Start: 2013-11-16 — End: 2013-11-21
  Administered 2013-11-16 – 2013-11-20 (×5): 40 mg via ORAL
  Filled 2013-11-16 (×6): qty 1

## 2013-11-16 MED ORDER — MENTHOL 3 MG MT LOZG
1.0000 | LOZENGE | OROMUCOSAL | Status: DC | PRN
  Administered 2013-11-16: 3 mg via ORAL
  Filled 2013-11-16 (×2): qty 9

## 2013-11-16 NOTE — Progress Notes (Signed)
UR complete.  Ciela Mahajan RN, MSN 

## 2013-11-16 NOTE — Progress Notes (Signed)
NEURO HOSPITALIST PROGRESS NOTE   SUBJECTIVE:                                                                                                                        Patient is awake and oriented to place and city.  She is working with PT at edge of bed.  She has no complaints but perseverating on need to go to the bathroom --even though she has a catheter.   OBJECTIVE:                                                                                                                           Vital signs in last 24 hours: Temp:  [98.4 F (36.9 C)-102.1 F (38.9 C)] 98.4 F (36.9 C) (01/02 0939) Pulse Rate:  [59-72] 59 (01/02 0939) Resp:  [12-20] 20 (01/02 0939) BP: (113-149)/(37-110) 121/37 mmHg (01/02 0941) SpO2:  [92 %-100 %] 98 % (01/02 0939) Weight:  [81.33 kg (179 lb 4.8 oz)-82.4 kg (181 lb 10.5 oz)] 81.33 kg (179 lb 4.8 oz) (01/01 2245)  Intake/Output from previous day:   Intake/Output this shift:   Nutritional status: Cardiac  Past Medical History  Diagnosis Date  . Diabetes mellitus type 2 with neurological manifestations   . Hypertension   . Dementia   . Arthritis   . Hypothyroid   . TIA (transient ischemic attack)   . Osteoarthritis     Neurologic Exam:  Mental Status: Alert, oriented.  Speech fluent without evidence of aphasia.  Able to follow 3 step commands without difficulty. Cranial Nerves: II: Discs flat bilaterally; Visual fields grossly normal, pupils equal, round, reactive to light and accommodation III,IV, VI: ptosis not present, extra-ocular motions intact bilaterally V,VII: smile symmetric, facial light touch sensation normal bilaterally VIII: hearing normal bilaterally IX,X: gag reflex present XI: bilateral shoulder shrug XII: midline tongue extension without atrophy or fasciculations  Motor: Right : Upper extremity   5/5    Left:     Upper extremity   5/5  Lower extremity   4/5     Lower extremity    4/5 --strength is effort dependent.  Tone and bulk:normal tone throughout; no atrophy noted Sensory: Pinprick and light touch intact throughout, bilaterally Deep Tendon Reflexes:  1+ bilateral UE with no AJ or KJ Plantars: Mute bilaterally Cerebellar: normal finger-to-nose,  normal heel-to-shin test   Lab Results: Lab Results  Component Value Date/Time   CHOL 185 05/28/2013 12:18 PM   Lipid Panel No results found for this basename: CHOL, TRIG, HDL, CHOLHDL, VLDL, LDLCALC,  in the last 72 hours  Studies/Results: Dg Chest 2 View  11/15/2013   CLINICAL DATA:  Code stroke.  Weakness and confusion.  EXAM: CHEST  2 VIEW  COMPARISON:  No priors.  FINDINGS: Bibasilar opacities (left greater than right) may reflect areas of atelectasis and/or consolidation. Probable trace left pleural effusion. Moderate venous congestion, without frank pulmonary edema. Mild cardiomegaly. Upper mediastinal contours are within normal limits. Atherosclerosis in the thoracic aorta.  IMPRESSION: 1. Bibasilar opacities (left greater than right) may reflect areas of atelectasis and/or consolidation. In the setting of suspected stroke, sequela of aspiration is not excluded. 2. Trace left pleural effusion. 3. Cardiomegaly with pulmonary venous congestion. 4. Atherosclerosis.   Electronically Signed   By: Trudie Reedaniel  Entrikin M.D.   On: 11/15/2013 20:15   Ct Head (brain) Wo Contrast  11/15/2013   CLINICAL DATA:  Right side weakness, right facial droop, confusion  EXAM: CT HEAD WITHOUT CONTRAST  TECHNIQUE: Contiguous axial images were obtained from the base of the skull through the vertex without intravenous contrast.  COMPARISON:  5/22/13CT head; 06/14/13 MRI  FINDINGS: No hemorrhage or extra-axial fluid. No hydrocephalus.There is low attenuation in the left frontal lobe subcortical white matter, which is expanded when compared to Apr 05, 2012 but stable when compared to June 14, 2013. There are no other foci of abnormal attenuation  to suggest acute infarction. Areas of chronic lacunar infarction bilateral cerebellar hemispheres stable.  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 No acute intracranial findings. Critical Value/emergent results were called by telephone at the time of interpretation on 11/15/2013 at 6:35 PM to Dr. Cyril Mourningamillo, who verbally acknowledged these results.   Electronically Signed   By: Esperanza Heiraymond  Rubner M.D.   On: 11/15/2013 18:35    MEDICATIONS                                                                                                                        Scheduled: . sodium chloride   Intravenous STAT  . aspirin EC  81 mg Oral Daily  . clopidogrel  75 mg Oral Q breakfast  . donepezil  5 mg Oral QHS  . heparin  5,000 Units Subcutaneous Q8H  . insulin aspart  0-15 Units Subcutaneous TID WC  . insulin aspart  0-5 Units Subcutaneous QHS  . levothyroxine  75 mcg Oral QAC breakfast  . piperacillin-tazobactam (ZOSYN)  IV  3.375 g Intravenous Q8H  . simvastatin  40 mg Oral q1800  . vancomycin  1,000 mg Intravenous Q12H    ASSESSMENT/PLAN:  AMS likely secondary to infection in the setting of underlying dementia.  Currently being treated with ABX and exam shows no focal or lateralizing weakness.  Stroke work up was canceled. Recommend continue ABX, and home dose ASA and Plavix.  Continue PT.  Neurology will S/O  Assessment and plan discussed with with [facility] and they are in agreement.    Felicie Morn PA-C [facility] [phone]  11/16/2013, 10:19 AM   Patient seen and examined together with physician assistant and I concur with the assessment and plan.  Wyatt Portela, MD

## 2013-11-16 NOTE — Progress Notes (Signed)
[facility] PROGRESS NOTE  Carol DunkerMarjorie Santiago UJW:[id number] DOB: [birth year] DOA: 11/15/2013 PCP: Rene PaciValerie Leschber, MD  Assessment/Plan: HCAPNA- IV abx and O2 supplementation, nebs  influenza A- tamiflu x 5 days- 1/2 start  Hypokalemia- replete  DM- SSI   Code Status: full Family Communication: no family at bedside Disposition Plan: SNF   Consultants:  Neuro- signed off  Procedures:  none  Antibiotics:  vanc  zosyn  HPI/Subjective: Sitting in chair eating No CP, no fever  Objective: Filed Vitals:   11/16/13 0941  BP: 121/37  Pulse:   Temp:   Resp:    No intake or output data in the 24 hours ending 11/16/13 1315 Filed Weights   11/15/13 1819 11/15/13 2245  Weight: 82.4 kg (181 lb 10.5 oz) 81.33 kg (179 lb 4.8 oz)    Exam:   General:  Pleasant/cooperative  Cardiovascular: rrr  Respiratory: coarse breath sounds, no wheezing  Abdomen: +BS, soft  Musculoskeletal:  Moves all 4 ext   Data Reviewed: Basic Metabolic Panel:  Recent Labs Lab 11/15/13 1828 11/16/13 0435  NA 142 141  K 3.6* 3.2*  CL 102 102  CO2 29 28  GLUCOSE 205* 151*  BUN 25* 24*  CREATININE 0.68 0.71  CALCIUM 8.4 7.9*   Liver Function Tests:  Recent Labs Lab 11/15/13 1828 11/16/13 0435  AST 20 19  ALT 14 12  ALKPHOS 48 42  BILITOT 1.4* 1.1  PROT 6.6 5.8*  ALBUMIN 3.3* 2.7*   No results found for this basename: LIPASE, AMYLASE,  in the last 168 hours No results found for this basename: AMMONIA,  in the last 168 hours CBC:  Recent Labs Lab 11/15/13 1828 11/16/13 0435  WBC 6.8 6.0  NEUTROABS 5.2 4.1  HGB 12.0 10.7*  HCT 34.9* 32.4*  MCV 96.9 99.7  PLT 181 147*   Cardiac Enzymes:  Recent Labs Lab 11/15/13 1828 11/16/13 0435  CKTOTAL  --  258*  TROPONINI <0.30  --    BNP (last 3 results) No results found for this basename: PROBNP,  in the last 8760 hours CBG:  Recent Labs Lab 11/15/13 1849 11/16/13 0259 11/16/13 0650 11/16/13 1113   GLUCAP 202* 136* 151* 190*    No results found for this or any previous visit (from the past 240 hour(s)).   Studies: Dg Chest 2 View  11/15/2013   CLINICAL DATA:  Code stroke.  Weakness and confusion.  EXAM: CHEST  2 VIEW  COMPARISON:  No priors.  FINDINGS: Bibasilar opacities (left greater than right) may reflect areas of atelectasis and/or consolidation. Probable trace left pleural effusion. Moderate venous congestion, without frank pulmonary edema. Mild cardiomegaly. Upper mediastinal contours are within normal limits. Atherosclerosis in the thoracic aorta.  IMPRESSION: 1. Bibasilar opacities (left greater than right) may reflect areas of atelectasis and/or consolidation. In the setting of suspected stroke, sequela of aspiration is not excluded. 2. Trace left pleural effusion. 3. Cardiomegaly with pulmonary venous congestion. 4. Atherosclerosis.   Electronically Signed   By: Trudie Reedaniel  Entrikin M.D.   On: 11/15/2013 20:15   Ct Head (brain) Wo Contrast  11/15/2013   CLINICAL DATA:  Right side weakness, right facial droop, confusion  EXAM: CT HEAD WITHOUT CONTRAST  TECHNIQUE: Contiguous axial images were obtained from the base of the skull through the vertex without intravenous contrast.  COMPARISON:  5/22/13CT head; 06/14/13 MRI  FINDINGS: No hemorrhage or extra-axial fluid. No hydrocephalus.There is low attenuation in the left frontal lobe subcortical white matter, which is expanded when compared  to Apr 05, 2012 but stable when compared to June 14, 2013. There are no other foci of abnormal attenuation to suggest acute infarction. Areas of chronic lacunar infarction bilateral cerebellar hemispheres stable.  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 No acute intracranial findings. Critical Value/emergent results were called by telephone at the time of interpretation on 11/15/2013 at 6:35 PM to Dr. Cyril Mourning, who verbally acknowledged these results.    Electronically Signed   By: Esperanza Heir M.D.   On: 11/15/2013 18:35    Scheduled Meds: . aspirin EC  81 mg Oral Daily  . clopidogrel  75 mg Oral Q breakfast  . donepezil  5 mg Oral QHS  . heparin  5,000 Units Subcutaneous Q8H  . insulin aspart  0-15 Units Subcutaneous TID WC  . insulin aspart  0-5 Units Subcutaneous QHS  . levothyroxine  75 mcg Oral QAC breakfast  . oseltamivir  30 mg Oral BID  . piperacillin-tazobactam (ZOSYN)  IV  3.375 g Intravenous Q8H  . potassium chloride  40 mEq Oral Once  . simvastatin  40 mg Oral q1800  . vancomycin  1,000 mg Intravenous Q12H   Continuous Infusions:   Principal Problem:   HCAP (healthcare-associated pneumonia) Active Problems:   TIA (transient ischemic attack)   Hypothyroid   Dementia   Hypertension   Diabetes mellitus type 2 with neurological manifestations    Time spent: 35 min    VANN, JESSICA  [facility] Pager [phone]. If 7PM-7AM, please contact night-coverage at [email or web address], password [facility] 11/16/2013, 1:15 PM  LOS: 1 day

## 2013-11-16 NOTE — Evaluation (Signed)
Occupational Therapy Evaluation Patient Details Name: Missey Hasley MRN: [id number] DOB: [birth year] Today's Date: 11/16/2013 Time: 1000-1041 OT Time Calculation (min): 41 min  OT Assessment / Plan / Recommendation History of present illness Pt is an 80 year old admitted from ALF. She presents with episode of confusion and a fall. It is unclear how long she was found on the floor. She has a cough as well as basal crackles. She was also found to be having febrile and her chest x-ray did show possible aspiration. CT of head negative. Question of left CVA with right sided weakness.    Clinical Impression   Pt admitted with above. Will continue to follow pt acutely in order to address below problem list. Recommend SNF for d/c planning (preferably at [facility]).      OT Assessment  Patient needs continued OT Services    Follow Up Recommendations  SNF;Supervision/Assistance - 24 hour    Barriers to Discharge      Equipment Recommendations   (TBD next venue)    Recommendations for Other Services    Frequency  Min 2X/week    Precautions / Restrictions Precautions Precautions: Fall   Pertinent Vitals/Pain See vitals    ADL  Toilet Transfer: Performed;Minimal assistance (2 person HHA) Toilet Transfer Method: Sit to stand Toilet Transfer Equipment: Comfort height toilet Equipment Used: Gait belt Transfers/Ambulation Related to ADLs: Pt ambulated with 2 person HHA but overall min assist.   ADL Comments: Pt sat EOB ~10 minutes with supervision.  Required max redirection due to perseveration on need to use bathroom (has a foley in place).      OT Diagnosis: Generalized weakness;Cognitive deficits  OT Problem List: Decreased strength;Decreased activity tolerance;Impaired balance (sitting and/or standing);Decreased cognition;Decreased knowledge of use of DME or AE OT Treatment Interventions: Self-care/ADL training;DME and/or AE instruction;Therapeutic activities;Patient/family  education;Balance training;Cognitive remediation/compensation   OT Goals(Current goals can be found in the care plan section) Acute Rehab OT Goals Patient Stated Goal: to go home OT Goal Formulation: With patient/family Time For Goal Achievement: 11/30/13 Potential to Achieve Goals: Good  Visit Information  Last OT Received On: 11/16/13 Assistance Needed: +1 (+2 helpful for lines) History of Present Illness: Pt is an 80 year old admitted from ALF. She presents with episode of confusion and a fall. It is unclear how long she was found on the floor. She has a cough as well as basal crackles. She was also found to be having febrile and her chest x-ray did show possible aspiration. CT of head negative. Question of left CVA with right sided weakness.        Prior Functioning     Home Living Family/patient expects to be discharged to:: Skilled nursing facility Prior Function Level of Independence: Independent with assistive device(s) Comments: Son present at end of session. He reports that pt was independent with self care but was not doing a thorough job. She had recently began to use a RW.  Lives in independent living section of [address] Communication Communication: No difficulties         Vision/Perception     Cognition  Cognition Arousal/Alertness: Awake/alert Behavior During Therapy: WFL for tasks assessed/performed Overall Cognitive Status: Impaired/Different from baseline Area of Impairment: Memory;Awareness;Problem solving;Orientation Orientation Level: Disoriented to;Place;Time Memory: Decreased short-term memory Awareness: Intellectual Problem Solving: Slow processing;Requires verbal cues General Comments: Pt perseverating on need to use bathroom despite foley catheter in place.     Extremity/Trunk Assessment Upper Extremity Assessment Upper Extremity Assessment: Overall WFL for tasks  assessed     Mobility Bed Mobility Bed Mobility: Supine to Sit;Sitting  - Scoot to Edge of Bed Supine to Sit: 2: Max assist;With rails;HOB elevated Sitting - Scoot to DelphiEdge of Bed: 2: Max assist Details for Bed Mobility Assistance: Incr time and slow to process directional cues. Use of draw pad to bring hips over to EOB.  Transfers Transfers: Sit to Stand;Stand to Sit Sit to Stand: 4: Min assist;From bed;From toilet;With upper extremity assist Stand to Sit: 4: Min assist;To chair/3-in-1;To toilet;With armrests;With upper extremity assist Details for Transfer Assistance: VCs to initiate transfer and for safe hand placement. Pt tends to reach forward for therapists for assist with standing.     Exercise     Balance Balance Balance Assessed: Yes Static Sitting Balance Static Sitting - Balance Support: Feet supported;No upper extremity supported Static Sitting - Level of Assistance: 5: Stand by assistance Static Sitting - Comment/# of Minutes: 10 minutes EOB.   End of Session OT - End of Session Equipment Utilized During Treatment: Gait belt Activity Tolerance: Patient tolerated treatment well Patient left: in chair;with call bell/phone within reach;with chair alarm set;with nursing/sitter in room;with family/visitor present Nurse Communication: Mobility status  GO    11/16/2013 Cipriano MileJohnson, Jaelyn Bourgoin Elizabeth OTR/L Pager [phone] Office [phone]  Cipriano MileJohnson, Pranavi Aure Elizabeth 11/16/2013, 10:57 AM

## 2013-11-16 NOTE — Evaluation (Signed)
Clinical/Bedside Swallow Evaluation Patient Details  Name: Carol DunkerMarjorie Santiago MRN: [id number] Date of Birth: 05/15/1925  Today's Date: 11/16/2013 Time: [id number] SLP Time Calculation (min): 24 min  Past Medical History:  Past Medical History  Diagnosis Date  . Diabetes mellitus type 2 with neurological manifestations   . Hypertension   . Dementia   . Arthritis   . Hypothyroid   . TIA (transient ischemic attack)   . Osteoarthritis    Past Surgical History:  Past Surgical History  Procedure Laterality Date  . Tonsillectomy  1938  . Left knee surgery  2003   HPI:  Pt is an 80 year old admitted from ALF. She presents with episode of confusion and a fall. It is unclear how long she was found on the floor. She has a cough as well as basal crackles. She was also found to be having febrile and her chest x-ray did show possible aspiration. CT of head negative. Question of left CVA with right sided weakness.    Assessment / Plan / Recommendation Clinical Impression  Pt does not demonstrate any evidence of acute dysphagia or aspiration. She does report occasional globus and early satiety, needing to stop during meals to let food "go down." Complaints consistent with a possible esophageal dysphagia. No diet modification or acute needs, but pt may benefit from f/u with GI in an outpatient setting if her symptoms are bothersome. For now recommend basic strategies and a regular texture diet and thin liquids. Screened pt for significant difference in speech, langauage or cognition. No acute needs at this time. Will sign off.     Aspiration Risk  Mild    Diet Recommendation Regular;Thin liquid   Liquid Administration via: Cup Medication Administration: Whole meds with liquid Supervision: Patient able to self feed Compensations: Small sips/bites;Slow rate;Follow solids with liquid Postural Changes and/or Swallow Maneuvers: Seated upright 90 degrees;Upright 30-60 min after meal    Other   Recommendations Oral Care Recommendations: Oral care BID   Follow Up Recommendations  None    Frequency and Duration        Pertinent Vitals/Pain NA    SLP Swallow Goals     Swallow Study Prior Functional Status       General HPI: Pt is an 80 year old admitted from ALF. She presents with episode of confusion and a fall. It is unclear how long she was found on the floor. She has a cough as well as basal crackles. She was also found to be having febrile and her chest x-ray did show possible aspiration. CT of head negative. Question of left CVA with right sided weakness.  Type of Study: Bedside swallow evaluation Diet Prior to this Study: NPO Temperature Spikes Noted: No Respiratory Status: Nasal cannula Behavior/Cognition: Alert;Cooperative;Pleasant mood Oral Cavity - Dentition: Adequate natural dentition Self-Feeding Abilities: Able to feed self Patient Positioning: Upright in bed Baseline Vocal Quality: Clear Volitional Cough: Strong Volitional Swallow: Able to elicit    Oral/Motor/Sensory Function Overall Oral Motor/Sensory Function: Appears within functional limits for tasks assessed   Ice Chips     Thin Liquid Thin Liquid: Within functional limits Presentation: Cup;Self Fed    Nectar Thick Nectar Thick Liquid: Not tested   Honey Thick Honey Thick Liquid: Not tested   Puree Puree: Within functional limits   Solid   GO    Solid: Within functional limits      The Endoscopy Center LLCBonnie Hershal Eriksson, MA CCC-SLP [phone]  Claudine MoutonDeBlois, Fritz Cauthon Caroline 11/16/2013,9:29 AM

## 2013-11-16 NOTE — Evaluation (Signed)
Physical Therapy Evaluation Patient Details Name: Carol Santiago MRN: [id number] DOB: 11-May-1925 Today's Date: 11/16/2013 Time: 4098-1191 PT Time Calculation (min): 24 min  PT Assessment / Plan / Recommendation History of Present Illness  Pt is an 80 year old admitted from ALF. She presents with episode of confusion and a fall. It is unclear how long she was found on the floor. She has a cough as well as basal crackles. She was also found to be having febrile and her chest x-ray did show possible aspiration. CT of head negative. Question of left CVA with right sided weakness.   Clinical Impression  Pt perseverates on needing to use the bathroom despite having a catheter.  Pt generally unsteady and deconditioned.  Feel SNF most appropriate D/C plan.      PT Assessment  Patient needs continued PT services    Follow Up Recommendations  SNF    Does the patient have the potential to tolerate intense rehabilitation      Barriers to Discharge        Equipment Recommendations  None recommended by PT    Recommendations for Other Services     Frequency Min 2X/week    Precautions / Restrictions Precautions Precautions: Fall Restrictions Weight Bearing Restrictions: No   Pertinent Vitals/Pain Denied pain.        Mobility  Bed Mobility Bed Mobility: Not assessed Supine to Sit: 2: Max assist;With rails;HOB elevated Sitting - Scoot to Edge of Bed: 2: Max assist Details for Bed Mobility Assistance: Incr time and slow to process directional cues. Use of draw pad to bring hips over to EOB.  Transfers Transfers: Sit to Stand;Stand to Sit Sit to Stand: 4: Min assist;With upper extremity assist;From bed;From toilet;From chair/3-in-1 Stand to Sit: 4: Min assist;With upper extremity assist;To toilet;To chair/3-in-1 Details for Transfer Assistance: cues for  Ambulation/Gait Ambulation/Gait Assistance: 1: +2 Total assist Ambulation/Gait: Patient Percentage: 70% Ambulation Distance  (Feet): 12 Feet (x2) Assistive device: 2 person hand held assist Ambulation/Gait Assistance Details: pt tries to reach out for staff and attempts to rely on staff to A, however feel would do better with a RW for stability and safety.   Gait Pattern: Step-through pattern;Decreased stride length;Shuffle;Trunk flexed;Narrow base of support Stairs: No Wheelchair Mobility Wheelchair Mobility: No    Exercises     PT Diagnosis: Difficulty walking  PT Problem List: Decreased strength;Decreased activity tolerance;Decreased balance;Decreased mobility;Decreased coordination;Decreased cognition;Decreased knowledge of use of DME;Decreased safety awareness PT Treatment Interventions: DME instruction;Gait training;Functional mobility training;Therapeutic activities;Therapeutic exercise;Balance training;Patient/family education     PT Goals(Current goals can be found in the care plan section) Acute Rehab PT Goals Patient Stated Goal: to go home PT Goal Formulation: With patient Time For Goal Achievement: 11/30/13 Potential to Achieve Goals: Good  Visit Information  Last PT Received On: 11/16/13 Assistance Needed: +1 History of Present Illness: Pt is an 80 year old admitted from ALF. She presents with episode of confusion and a fall. It is unclear how long she was found on the floor. She has a cough as well as basal crackles. She was also found to be having febrile and her chest x-ray did show possible aspiration. CT of head negative. Question of left CVA with right sided weakness.        Prior Functioning  Home Living Family/patient expects to be discharged to:: Skilled nursing facility Prior Function Level of Independence: Independent with assistive device(s) Comments: Son present at end of session. He reports that pt was independent with self care  but was not doing a thorough job. She had recently began to use a RW.  [facility] [address]. Communication Communication: No difficulties    Cognition  Cognition Arousal/Alertness: Awake/alert Behavior During Therapy: WFL for tasks assessed/performed Overall Cognitive Status: Impaired/Different from baseline Area of Impairment: Memory;Awareness;Problem solving;Orientation Orientation Level: Disoriented to;Place;Time Memory: Decreased short-term memory Awareness: Intellectual Problem Solving: Slow processing;Requires verbal cues General Comments: Pt perseverating on need to use bathroom despite foley catheter in place.     Extremity/Trunk Assessment Upper Extremity Assessment Upper Extremity Assessment: Defer to OT evaluation Lower Extremity Assessment Lower Extremity Assessment: Generalized weakness Cervical / Trunk Assessment Cervical / Trunk Assessment: Kyphotic   Balance Balance Balance Assessed: Yes Static Sitting Balance Static Sitting - Balance Support: Feet supported;No upper extremity supported Static Sitting - Level of Assistance: 5: Stand by assistance Static Sitting - Comment/# of Minutes: 10 minutes EOB.  End of Session PT - End of Session Equipment Utilized During Treatment: Gait belt Activity Tolerance: Patient tolerated treatment well Patient left: in chair;with call bell/phone within reach;with chair alarm set Nurse Communication: Mobility status  GP     [name], [name], South CarolinaPT [phone] 11/16/2013, 12:43 PM

## 2013-11-17 LAB — GLUCOSE, CAPILLARY
GLUCOSE-CAPILLARY: 200 mg/dL — AB (ref 70–99)
Glucose-Capillary: 170 mg/dL — ABNORMAL HIGH (ref 70–99)
Glucose-Capillary: 201 mg/dL — ABNORMAL HIGH (ref 70–99)
Glucose-Capillary: 153 mg/dL — ABNORMAL HIGH (ref 70–99)

## 2013-11-17 LAB — BASIC METABOLIC PANEL
BUN: 23 mg/dL (ref 6–23)
CHLORIDE: 103 meq/L (ref 96–112)
CO2: 26 meq/L (ref 19–32)
CREATININE: 0.74 mg/dL (ref 0.50–1.10)
Calcium: 7.9 mg/dL — ABNORMAL LOW (ref 8.4–10.5)
GFR calc non Af Amer: 75 mL/min — ABNORMAL LOW (ref >90)
GFR, EST AFRICAN AMERICAN: 87 mL/min — AB (ref >90)
Glucose, Bld: 212 mg/dL — ABNORMAL HIGH (ref 70–99)
POTASSIUM: 3.6 meq/L — AB (ref 3.7–5.3)
Sodium: 141 mEq/L (ref 137–147)

## 2013-11-17 LAB — VANCOMYCIN, TROUGH: Vancomycin Tr: 11.8 ug/mL (ref 10.0–20.0)

## 2013-11-17 LAB — CBC
HCT: 33.3 % — ABNORMAL LOW (ref 36.0–46.0)
Hemoglobin: 11.2 g/dL — ABNORMAL LOW (ref 12.0–15.0)
MCH: 33.5 pg (ref 26.0–34.0)
MCHC: 33.6 g/dL (ref 30.0–36.0)
MCV: 99.7 fL (ref 78.0–100.0)
Platelets: 169 10*3/uL (ref 150–400)
RBC: 3.34 MIL/uL — ABNORMAL LOW (ref 3.87–5.11)
RDW: 14.1 % (ref 11.5–15.5)
WBC: 4.7 10*3/uL (ref 4.0–10.5)

## 2013-11-17 MED ORDER — VANCOMYCIN HCL 10 G IV SOLR
1250.0000 mg | Freq: Two times a day (BID) | INTRAVENOUS | Status: DC
  Administered 2013-11-17 – 2013-11-20 (×6): 1250 mg via INTRAVENOUS
  Filled 2013-11-17 (×7): qty 1250

## 2013-11-17 NOTE — Progress Notes (Signed)
[facility] PROGRESS NOTE  Erica Richwine ZOX:[id number] DOB: [birth year] DOA: 11/15/2013 PCP: Rene Paci, MD  Assessment/Plan: HCAPNA- IV abx and nebs- off O2  influenza A- tamiflu x 5 days- 1/2 start  Hypokalemia- replete  DM- SSI   Code Status: full Family Communication: son at bedside Disposition Plan: SNF   Consultants:  Neuro- signed off  Procedures:  none  Antibiotics:  vanc  zosyn  HPI/Subjective: Sitting in bed Feeling better No CP, no fever  Objective: Filed Vitals:   11/17/13 1010  BP: 150/60  Pulse: 70  Temp: 98.7 F (37.1 C)  Resp: 20    Intake/Output Summary (Last 24 hours) at 11/17/13 1329 Last data filed at 11/16/13 1354  Gross per 24 hour  Intake      0 ml  Output    500 ml  Net   -500 ml   Filed Weights   11/15/13 1819 11/15/13 2245  Weight: 82.4 kg (181 lb 10.5 oz) 81.33 kg (179 lb 4.8 oz)    Exam:   General:  Pleasant/cooperative  Cardiovascular: rrr  Respiratory: coarse breath sounds, no wheezing  Abdomen: +BS, soft  Musculoskeletal:  Moves all 4 ext   Data Reviewed: Basic Metabolic Panel:  Recent Labs Lab 11/15/13 1828 11/16/13 0435 11/17/13 0535  NA 142 141 141  K 3.6* 3.2* 3.6*  CL 102 102 103  CO2 29 28 26   GLUCOSE 205* 151* 212*  BUN 25* 24* 23  CREATININE 0.68 0.71 0.74  CALCIUM 8.4 7.9* 7.9*   Liver Function Tests:  Recent Labs Lab 11/15/13 1828 11/16/13 0435  AST 20 19  ALT 14 12  ALKPHOS 48 42  BILITOT 1.4* 1.1  PROT 6.6 5.8*  ALBUMIN 3.3* 2.7*   No results found for this basename: LIPASE, AMYLASE,  in the last 168 hours No results found for this basename: AMMONIA,  in the last 168 hours CBC:  Recent Labs Lab 11/15/13 1828 11/16/13 0435 11/17/13 0535  WBC 6.8 6.0 4.7  NEUTROABS 5.2 4.1  --   HGB 12.0 10.7* 11.2*  HCT 34.9* 32.4* 33.3*  MCV 96.9 99.7 99.7  PLT 181 147* 169   Cardiac Enzymes:  Recent Labs Lab 11/15/13 1828 11/16/13 0435  CKTOTAL  --   258*  TROPONINI <0.30  --    BNP (last 3 results) No results found for this basename: PROBNP,  in the last 8760 hours CBG:  Recent Labs Lab 11/16/13 1113 11/16/13 1643 11/16/13 2253 11/17/13 0749 11/17/13 1152  GLUCAP 190* 130* 176* 200* 170*    No results found for this or any previous visit (from the past 240 hour(s)).   Studies: Dg Chest 2 View  11/15/2013   CLINICAL DATA:  Code stroke.  Weakness and confusion.  EXAM: CHEST  2 VIEW  COMPARISON:  No priors.  FINDINGS: Bibasilar opacities (left greater than right) may reflect areas of atelectasis and/or consolidation. Probable trace left pleural effusion. Moderate venous congestion, without frank pulmonary edema. Mild cardiomegaly. Upper mediastinal contours are within normal limits. Atherosclerosis in the thoracic aorta.  IMPRESSION: 1. Bibasilar opacities (left greater than right) may reflect areas of atelectasis and/or consolidation. In the setting of suspected stroke, sequela of aspiration is not excluded. 2. Trace left pleural effusion. 3. Cardiomegaly with pulmonary venous congestion. 4. Atherosclerosis.   Electronically Signed   By: Trudie Reed M.D.   On: 11/15/2013 20:15   Ct Head (brain) Wo Contrast  11/15/2013   CLINICAL DATA:  Right side weakness, right facial droop,  confusion  EXAM: CT HEAD WITHOUT CONTRAST  TECHNIQUE: Contiguous axial images were obtained from the base of the skull through the vertex without intravenous contrast.  COMPARISON:  5/22/13CT head; 06/14/13 MRI  FINDINGS: No hemorrhage or extra-axial fluid. No hydrocephalus.There is low attenuation in the left frontal lobe subcortical white matter, which is expanded when compared to Apr 05, 2012 but stable when compared to June 14, 2013. There are no other foci of abnormal attenuation to suggest acute infarction. Areas of chronic lacunar infarction bilateral cerebellar hemispheres stable.  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 No acute intracranial findings. Critical Value/emergent results were called by telephone at the time of interpretation on 11/15/2013 at 6:35 PM to Dr. Cyril Mourningamillo, who verbally acknowledged these results.   Electronically Signed   By: Esperanza Heiraymond  Rubner M.D.   On: 11/15/2013 18:35    Scheduled Meds: . aspirin EC  81 mg Oral Daily  . clopidogrel  75 mg Oral Q breakfast  . donepezil  5 mg Oral QHS  . heparin  5,000 Units Subcutaneous Q8H  . insulin aspart  0-15 Units Subcutaneous TID WC  . insulin aspart  0-5 Units Subcutaneous QHS  . levothyroxine  75 mcg Oral QAC breakfast  . oseltamivir  30 mg Oral BID  . piperacillin-tazobactam (ZOSYN)  IV  3.375 g Intravenous Q8H  . simvastatin  40 mg Oral q1800  . vancomycin  1,250 mg Intravenous Q12H   Continuous Infusions:   Principal Problem:   HCAP (healthcare-associated pneumonia) Active Problems:   TIA (transient ischemic attack)   Hypothyroid   Dementia   Hypertension   Diabetes mellitus type 2 with neurological manifestations    Time spent: 35 min    Carol Santiago  [facility] Pager [phone]. If 7PM-7AM, please contact night-coverage at [email or web address], password [email or web address] 11/17/2013, 1:29 PM  LOS: 2 days

## 2013-11-17 NOTE — Progress Notes (Signed)
ANTIBIOTIC CONSULT NOTE - Follow-up  Pharmacy Consult for Vancomycin & zosyn Indication: rule out pneumonia  No Known Allergies  Patient Measurements: Height: 5\' 6"  (167.6 cm) Weight: 179 lb 4.8 oz (81.33 kg) IBW/kg (Calculated) : 59.3 Vital Signs: Temp: 98.7 F (37.1 C) (01/03 1010) Temp src: Oral (01/03 1010) BP: 150/60 mmHg (01/03 1010) Pulse Rate: 70 (01/03 1010) Labs:  Recent Labs  11/15/13 1828 11/16/13 0435 11/17/13 0535  WBC 6.8 6.0 4.7  HGB 12.0 10.7* 11.2*  PLT 181 147* 169  CREATININE 0.68 0.71 0.74   Estimated Creatinine Clearance: 54.3 ml/min (by C-G formula based on Cr of 0.74).  Microbiology: No results found for this or any previous visit (from the past 720 hour(s)).  Assessment: [age] YOF admitted after being found down at her independent senior living home with a fever continues on vancomycin and zosyn for HCAP. Pt is afebrile and WBC is WNL. Scr remains stable. A vancomycin trough was checked today and it is slightly below goal at 11.8. However it was checked a bit early.  Goal of Therapy:  Vancomycin trough level 15-20 mcg/ml  Plan:  1. Change vancomycin to 1250mg  IV Q12H 2. Continue zosyn as ordered 3. F/u renal fxn, C&S, clinical status and [facility]  Arieon Corcoran, PharmD, BCPS Pager # [phone] 11/17/2013 10:25 AM

## 2013-11-18 DIAGNOSIS — F0390 Unspecified dementia without behavioral disturbance: Secondary | ICD-10-CM

## 2013-11-18 LAB — GLUCOSE, CAPILLARY
Glucose-Capillary: 159 mg/dL — ABNORMAL HIGH (ref 70–99)
Glucose-Capillary: 139 mg/dL — ABNORMAL HIGH (ref 70–99)
Glucose-Capillary: 185 mg/dL — ABNORMAL HIGH (ref 70–99)
Glucose-Capillary: 201 mg/dL — ABNORMAL HIGH (ref 70–99)

## 2013-11-18 MED ORDER — HYDROCHLOROTHIAZIDE 12.5 MG PO CAPS
12.5000 mg | ORAL_CAPSULE | Freq: Every day | ORAL | Status: DC
  Administered 2013-11-18 – 2013-11-19 (×2): 12.5 mg via ORAL
  Filled 2013-11-18 (×2): qty 1

## 2013-11-18 MED ORDER — LISINOPRIL 10 MG PO TABS
10.0000 mg | ORAL_TABLET | Freq: Every day | ORAL | Status: DC
  Administered 2013-11-18 – 2013-11-19 (×2): 10 mg via ORAL
  Filled 2013-11-18 (×2): qty 1

## 2013-11-18 MED ORDER — LISINOPRIL-HYDROCHLOROTHIAZIDE 10-12.5 MG PO TABS: 1.0000 | ORAL_TABLET | Freq: Every day | ORAL | Status: DC

## 2013-11-18 NOTE — Progress Notes (Signed)
[facility] PROGRESS NOTE  Talea Manges ZOX:[id number] DOB: February 27, 1925 DOA: 11/15/2013 PCP: Rene Paci, MD  Assessment/Plan: HCAPNA- IV abx and nebs- off O2- change to PO abx in AM if patient still doing well  influenza A- tamiflu x 5 days- 1/2 start  Hypokalemia- replete  DM- SSI, was on 70/30 at [facility]  From [facility], may need SNF - would get PT eval again in AM   Code Status: full Family Communication: son at bedside Disposition Plan: SNF   Consultants:  Neuro- signed off  Procedures:  none  Antibiotics:  vanc  zosyn  HPI/Subjective: Sitting in bed Feeling better No CP, no fever  Objective: Filed Vitals:   11/18/13 0910  BP: 152/94  Pulse: 62  Temp: 98.7 F (37.1 C)  Resp: 20   No intake or output data in the 24 hours ending 11/18/13 1246 Filed Weights   11/15/13 1819 11/15/13 2245  Weight: 82.4 kg (181 lb 10.5 oz) 81.33 kg (179 lb 4.8 oz)    Exam:   General:  Pleasant/cooperative  Cardiovascular: rrr  Respiratory: coarse breath sounds, no wheezing  Abdomen: +BS, soft  Musculoskeletal:  Moves all 4 ext   Data Reviewed: Basic Metabolic Panel:  Recent Labs Lab 11/15/13 1828 11/16/13 0435 11/17/13 0535  NA 142 141 141  K 3.6* 3.2* 3.6*  CL 102 102 103  CO2 29 28 26   GLUCOSE 205* 151* 212*  BUN 25* 24* 23  CREATININE 0.68 0.71 0.74  CALCIUM 8.4 7.9* 7.9*   Liver Function Tests:  Recent Labs Lab 11/15/13 1828 11/16/13 0435  AST 20 19  ALT 14 12  ALKPHOS 48 42  BILITOT 1.4* 1.1  PROT 6.6 5.8*  ALBUMIN 3.3* 2.7*   No results found for this basename: LIPASE, AMYLASE,  in the last 168 hours No results found for this basename: AMMONIA,  in the last 168 hours CBC:  Recent Labs Lab 11/15/13 1828 11/16/13 0435 11/17/13 0535  WBC 6.8 6.0 4.7  NEUTROABS 5.2 4.1  --   HGB 12.0 10.7* 11.2*  HCT 34.9* 32.4* 33.3*  MCV 96.9 99.7 99.7  PLT 181 147* 169   Cardiac Enzymes:  Recent Labs Lab  11/15/13 1828 11/16/13 0435  CKTOTAL  --  258*  TROPONINI <0.30  --    BNP (last 3 results) No results found for this basename: PROBNP,  in the last 8760 hours CBG:  Recent Labs Lab 11/17/13 1152 11/17/13 1716 11/17/13 2154 11/18/13 0635 11/18/13 1121  GLUCAP 170* 201* 153* 159* 139*    Recent Results (from the past 240 hour(s))  CULTURE, BLOOD (ROUTINE X 2)     Status: None   Collection Time    11/15/13 11:05 PM      Result Value Range Status   Specimen Description BLOOD RIGHT ARM   Final   Special Requests BOTTLES DRAWN AEROBIC AND ANAEROBIC 10CC   Final   Culture  Setup Time     Final   Value: 11/16/2013 03:03     Performed at [facility]   Culture     Final   Value:        BLOOD CULTURE RECEIVED NO GROWTH TO DATE CULTURE WILL BE HELD FOR 5 DAYS BEFORE ISSUING A FINAL NEGATIVE REPORT     Performed at [facility]   Report Status PENDING   Incomplete  CULTURE, BLOOD (ROUTINE X 2)     Status: None   Collection Time    11/15/13 11:05 PM  Result Value Range Status   Specimen Description BLOOD RIGHT HAND   Final   Special Requests BOTTLES DRAWN AEROBIC ONLY 10CC   Final   Culture  Setup Time     Final   Value: 11/16/2013 03:02     [facility]formed at [facility]   Culture     Final   Value:        BLOOD CULTURE RECEIVED NO GROWTH TO DATE CULTURE WILL BE HELD FOR 5 DAYS BEFORE ISSUING A FINAL NEGATIVE REPORT     Performed at [facility]   Report Status PENDING   Incomplete     Studies: No results found.  Scheduled Meds: . aspirin EC  81 mg Oral Daily  . clopidogrel  75 mg Oral Q breakfast  . donepezil  5 mg Oral QHS  . heparin  5,000 Units Subcutaneous Q8H  . insulin aspart  0-15 Units Subcutaneous TID WC  . insulin aspart  0-5 Units Subcutaneous QHS  . levothyroxine  75 mcg Oral QAC breakfast  . oseltamivir  30 mg Oral BID  . piperacillin-tazobactam (ZOSYN)  IV  3.375 g Intravenous Q8H  . simvastatin  40 mg Oral q1800  .  vancomycin  1,250 mg Intravenous Q12H   Continuous Infusions:   Principal Problem:   HCAP (healthcare-associated pneumonia) Active Problems:   TIA (transient ischemic attack)   Hypothyroid   Dementia   Hypertension   Diabetes mellitus type 2 with neurological manifestations    Time spent: 35 min    Caretha Rumbaugh  [facility] Pager [phone]. If 7PM-7AM, please contact night-coverage at [email or web address], password [email or web address] 11/18/2013, 12:46 PM  LOS: 3 days

## 2013-11-19 ENCOUNTER — Inpatient Hospital Stay (HOSPITAL_COMMUNITY): Payer: Medicare Other

## 2013-11-19 LAB — GLUCOSE, CAPILLARY
Glucose-Capillary: 154 mg/dL — ABNORMAL HIGH (ref 70–99)
Glucose-Capillary: 227 mg/dL — ABNORMAL HIGH (ref 70–99)
Glucose-Capillary: 213 mg/dL — ABNORMAL HIGH (ref 70–99)
Glucose-Capillary: 160 mg/dL — ABNORMAL HIGH (ref 70–99)

## 2013-11-19 LAB — HEPATIC FUNCTION PANEL
ALT: 24 U/L (ref 0–35)
AST: 26 U/L (ref 0–37)
Albumin: 2.6 g/dL — ABNORMAL LOW (ref 3.5–5.2)
Alkaline Phosphatase: 42 U/L (ref 39–117)
BILIRUBIN TOTAL: 0.7 mg/dL (ref 0.3–1.2)
Total Protein: 5.8 g/dL — ABNORMAL LOW (ref 6.0–8.3)

## 2013-11-19 MED ORDER — HYDROCHLOROTHIAZIDE 25 MG PO TABS
25.0000 mg | ORAL_TABLET | Freq: Every day | ORAL | Status: DC
  Administered 2013-11-20 – 2013-11-21 (×2): 25 mg via ORAL
  Filled 2013-11-19 (×2): qty 1

## 2013-11-19 MED ORDER — POTASSIUM CHLORIDE CRYS ER 20 MEQ PO TBCR
40.0000 meq | EXTENDED_RELEASE_TABLET | Freq: Once | ORAL | Status: AC
  Administered 2013-11-19: 40 meq via ORAL
  Filled 2013-11-19: qty 2

## 2013-11-19 MED ORDER — LISINOPRIL 40 MG PO TABS
40.0000 mg | ORAL_TABLET | Freq: Every day | ORAL | Status: DC
  Administered 2013-11-20 – 2013-11-21 (×2): 40 mg via ORAL
  Filled 2013-11-19 (×2): qty 1

## 2013-11-19 MED ORDER — HYDRALAZINE HCL 25 MG PO TABS
25.0000 mg | ORAL_TABLET | Freq: Four times a day (QID) | ORAL | Status: DC | PRN
  Administered 2013-11-20 – 2013-11-21 (×2): 25 mg via ORAL
  Filled 2013-11-19 (×2): qty 1

## 2013-11-19 NOTE — Progress Notes (Addendum)
Clinical Social Work Department BRIEF PSYCHOSOCIAL ASSESSMENT 11/19/2013  Patient:  Cleophas DunkerJOHNSON,Yeraldine     Account Number:  [id number]     Admit date:  11/15/2013  Clinical Social Worker:  Carren RangPURITZ,Sharren Schnurr, LCSWA  Date/Time:  11/19/2013 01:01 PM  Referred by:  RN  Date Referred:  11/19/2013 Referred for  SNF Placement   Other Referral:   Interview type:  Family Other interview type:   CSW spoke to patient's son over the phone    PSYCHOSOCIAL DATA Living Status:  FACILITY Admitted from facility:  [facility] Level of care:  Independent Primary support name:  Shan LevansRandal Mandile Primary support relationship to patient:  CHILD, ADULT Degree of support available:   Good    CURRENT CONCERNS Current Concerns  Post-Acute Placement   Other Concerns:    SOCIAL WORK ASSESSMENT / PLAN CSW received referral that [facility]. CSW reviewed chart. Patient alert to person and place, with hx of dementia. CSW called patient's son over the phone, Shan LevansRandal Ray. CSW introduced self and explained reason for phone call. Patient's son stated [facility] and does not see why patient would not be able to return back there. CSW explained PT recommendation, and patient's son asked social worker to call [facility] to see if they are able to take patient back. [facility]. CSW is awaiting phone call.   Assessment/plan status:  Psychosocial Support/Ongoing Assessment of Needs Other assessment/ plan:   Information/referral to community resources:   SNF vs ALF    PATIENT'S/FAMILY'S RESPONSE TO PLAN OF CARE: Patient's son states he prefers patient goes back to Independent, but is open to SNF if can not take patient back.       Maree KrabbeLindsay Yulianna Folse, MSW, Theresia MajorsLCSWA [phone]

## 2013-11-19 NOTE — Progress Notes (Signed)
CSW spoke to Carol Santiago from [facility], stating she thinks SNF is a better option for patient at this time. CSW spoke to patient's son and his preference is [facility]. CSW will update when bed offers arise.  Maree KrabbeLindsay Rashonda Warrior, MSW, Theresia MajorsLCSWA [phone]

## 2013-11-19 NOTE — Progress Notes (Signed)
Clinical Social Work Department CLINICAL SOCIAL WORK PLACEMENT NOTE 11/19/2013  Patient:  Carol Santiago,Carol Santiago  Account Number:  [id number] Admit date:  11/15/2013  Clinical Social Worker:  Carren RangLINDSAY Lenell Mcconnell, LCSWA  Date/time:  11/19/2013 01:02 PM  Clinical Social Work is seeking post-discharge placement for this patient at the following level of care:   SKILLED NURSING   (*CSW will update this form in Epic as items are completed)   11/19/2013  Patient/family provided with [facility] Department of Clinical Social Work's list of facilities offering this level of care within the geographic area requested by the patient (or if unable, by the patient's family).  11/19/2013  Patient/family informed of their freedom to choose among providers that offer the needed level of care, that participate in Medicare, Medicaid or managed care program needed by the patient, have an available bed and are willing to accept the patient.  11/19/2013  Patient/family informed of [facility]' ownership interest in [facility], as well as of the fact that they are under no obligation to receive care at this facility.  PASARR submitted to EDS on 11/19/2013 PASARR number received from EDS on   FL2 transmitted to all facilities in geographic area requested by pt/family on  11/19/2013 FL2 transmitted to all facilities within larger geographic area on   Patient informed that his/her managed care company has contracts with or will negotiate with  certain facilities, including the following:     Patient/family informed of bed offers received:   Patient chooses bed at  Physician recommends and patient chooses bed at    Patient to be transferred to  on   Patient to be transferred to facility by   The following physician request were entered in Epic:   Additional Comments:  Carol Santiago, MSW, [employer]CSWA [phone]

## 2013-11-19 NOTE — Progress Notes (Signed)
[facility] PROGRESS NOTE  Carol Santiago ZOX:[id number] DOB: Aug 31, 1925 DOA: 11/15/2013 PCP: Rene Paci, MD   Brief narrative 80 y/o female with hx of DM, dementia, HTN, hypothyroidism from ALF, admitted with AMS likely in the setting of HCAP, flu and dehydration.   Assessment/Plan: HCAP/ influnza A On empiric vanco and zosyn. Afebrile.  Cultures negative continue tamiflu ( day 5/6)   Type 2 DM  on SSI. Monitor Fsg  Hypokalemia  repleted  Hx of CVA  on ASA and plavix. Added statin  Uncontrolled HTN  will increase her dose of HCTz-lisinopril. Added prn hydralazine  Dementia continue aricept  Hypothyroidism  continue synthroid  abdominal pain C/o RUQ pain on palpation, non distended. LFTs and xray normal. Has hx of  gallstones. Will obtain US liver  Code Status: full Family Communication:will update son Disposition Plan: SNF   Consultants:  none  Procedures:  none  Antibiotics:  IV vanco and zosyn ( 1/1>>)  HPI/Subjective: Denies any symptoms. Asks " how long do i have to live with this stress?"  Objective: Filed Vitals:   11/19/13 1442  BP: 166/60  Pulse: 74  Temp: 97.5 F (36.4 C)  Resp: 20   No intake or output data in the 24 hours ending 11/19/13 1519 Filed Weights   11/15/13 1819 11/15/13 2245  Weight: 82.4 kg (181 lb 10.5 oz) 81.33 kg (179 lb 4.8 oz)    Exam:   General:  Elderly ferale in NAD  HEENY: no pallor, moist oral mucosa  c hest : clear b/l, no added sounds   CVS: NS1&S2, no murmurs, rubs r gallop  Abd: soft, ND, BS+, Tender to palpation over RUQ  Ext: warm, no edema  CNS: AAOX2    Data Reviewed: Basic Metabolic Panel:  Recent Labs Lab 11/15/13 1828 11/16/13 0435 11/17/13 0535  NA 142 141 141  K 3.6* 3.2* 3.6*  CL 102 102 103  CO2 29 28 26   GLUCOSE 205* 151* 212*  BUN 25* 24* 23  CREATININE 0.68 0.71 0.74  CALCIUM 8.4 7.9* 7.9*   Liver Function Tests:  Recent Labs Lab 11/15/13 1828  11/16/13 0435 11/19/13 1140  AST 20 19 26   ALT 14 12 24   ALKPHOS 48 42 42  BILITOT 1.4* 1.1 0.7  PROT 6.6 5.8* 5.8*  ALBUMIN 3.3* 2.7* 2.6*   No results found for this basename: LIPASE, AMYLASE,  in the last 168 hours No results found for this basename: AMMONIA,  in the last 168 hours CBC:  Recent Labs Lab 11/15/13 1828 11/16/13 0435 11/17/13 0535  WBC 6.8 6.0 4.7  NEUTROABS 5.2 4.1  --   HGB 12.0 10.7* 11.2*  HCT 34.9* 32.4* 33.3*  MCV 96.9 99.7 99.7  PLT 181 147* 169   Cardiac Enzymes:  Recent Labs Lab 11/15/13 1828 11/16/13 0435  CKTOTAL  --  258*  TROPONINI <0.30  --    BNP (last 3 results) No results found for this basename: PROBNP,  in the last 8760 hours CBG:  Recent Labs Lab 11/18/13 1121 11/18/13 1624 11/18/13 2241 11/19/13 0655 11/19/13 1137  GLUCAP 139* 185* 201* 154* 227*    Recent Results (from the past 240 hour(s))  CULTURE, BLOOD (ROUTINE X 2)     Status: None   Collection Time    11/15/13 11:05 PM      Result Value Range Status   Specimen Description BLOOD RIGHT ARM   Final   Special Requests BOTTLES DRAWN AEROBIC AND ANAEROBIC 10CC   Final  Culture  Setup Time     Final   Value: 11/16/2013 03:03     Performed at [facility]   Culture     Final   Value:        BLOOD CULTURE RECEIVED NO GROWTH TO DATE CULTURE WILL BE HELD FOR 5 DAYS BEFORE ISSUING A FINAL NEGATIVE REPORT     [facility]formed at [facility]   Report Status PENDING   Incomplete  CULTURE, BLOOD (ROUTINE X 2)     Status: None   Collection Time    11/15/13 11:05 PM      Result Value Range Status   Specimen Description BLOOD RIGHT HAND   Final   Special Requests BOTTLES DRAWN AEROBIC ONLY 10CC   Final   Culture  Setup Time     Final   Value: 11/16/2013 03:02     Performed at [facility]   Culture     Final   Value:        BLOOD CULTURE RECEIVED NO GROWTH TO DATE CULTURE WILL BE HELD FOR 5 DAYS BEFORE ISSUING A FINAL NEGATIVE REPORT     Performed  at [facility]   Report Status PENDING   Incomplete     Studies: Dg Abd Portable 1v  11/19/2013   CLINICAL DATA:  Abdominal pain and distention  EXAM: PORTABLE ABDOMEN - 1 VIEW  COMPARISON:  None.  FINDINGS: The bowel gas pattern is normal. There is a gallstone within the right upper quadrant of the abdomen measuring 1.2 cm.  IMPRESSION: 1. Normal bowel gas pattern. 2. Gallstone.   Electronically Signed   By: Signa Kellaylor  Stroud M.D.   On: 11/19/2013 14:27    Scheduled Meds: . aspirin EC  81 mg Oral Daily  . clopidogrel  75 mg Oral Q breakfast  . donepezil  5 mg Oral QHS  . heparin  5,000 Units Subcutaneous Q8H  . [START ON 11/20/2013] lisinopril  40 mg Oral Daily   And  . [START ON 11/20/2013] hydrochlorothiazide  25 mg Oral Daily  . insulin aspart  0-15 Units Subcutaneous TID WC  . insulin aspart  0-5 Units Subcutaneous QHS  . levothyroxine  75 mcg Oral QAC breakfast  . oseltamivir  30 mg Oral BID  . piperacillin-tazobactam (ZOSYN)  IV  3.375 g Intravenous Q8H  . simvastatin  40 mg Oral q1800  . vancomycin  1,250 mg Intravenous Q12H   Continuous Infusions:     Time spent: 25 minutes    Sophi Calligan  [facility] Pager [phone]. If 7PM-7AM, please contact night-coverage at [email or web address], password [email or web address] 11/19/2013, 3:19 PM  LOS: 4 days

## 2013-11-19 NOTE — Progress Notes (Signed)
Physical Therapy Treatment Patient Details Name: Carol Santiago MRN: [id number] DOB: 06/29/1925 Today's Date: 11/19/2013 Time: [id number] PT Time Calculation (min): 12 min  PT Assessment / Plan / Recommendation  History of Present Illness Pt is an 80 year old admitted from ALF. She presents with episode of confusion and a fall. It is unclear how long she was found on the floor. She has a cough as well as basal crackles. She was also found to be having febrile and her chest x-ray did show possible aspiration. CT of head negative. Question of left CVA with right sided weakness.    PT Comments   Pt cognition limiting participation in mobility.  Pt was agreeable to OOB to chair only.  No family present during session.  Will continue to follow.    Follow Up Recommendations  SNF     Does the patient have the potential to tolerate intense rehabilitation     Barriers to Discharge        Equipment Recommendations  None recommended by PT    Recommendations for Other Services    Frequency Min 2X/week   Progress towards PT Goals Progress towards PT goals: Progressing toward goals  Plan Current plan remains appropriate    Precautions / Restrictions Precautions Precautions: Fall Restrictions Weight Bearing Restrictions: No   Pertinent Vitals/Pain Denied pain.      Mobility  Bed Mobility Bed Mobility: Supine to Sit;Sitting - Scoot to Edge of Bed Supine to Sit: 3: Mod assist;HOB elevated;With rails Sitting - Scoot to Edge of Bed: 1: +1 Total assist Details for Bed Mobility Assistance: pt needs sequential cueing and encouragement.   Transfers Transfers: Sit to Stand;Stand to Dollar GeneralSit;Stand Pivot Transfers Sit to Stand: 4: Min assist;With upper extremity assist;From bed Stand to Sit: 4: Min assist;With upper extremity assist;To chair/3-in-1 Stand Pivot Transfers: 3: Mod assist Details for Transfer Assistance: cues for UE use and attending to task.   Ambulation/Gait Ambulation/Gait  Assistance: Not tested (comment) Stairs: No Wheelchair Mobility Wheelchair Mobility: No    Exercises     PT Diagnosis:    PT Problem List:   PT Treatment Interventions:     PT Goals (current goals can now be found in the care plan section) Acute Rehab PT Goals Patient Stated Goal: to go home Time For Goal Achievement: 11/30/13 Potential to Achieve Goals: Good  Visit Information  Last PT Received On: 11/19/13 Assistance Needed: +1 History of Present Illness: Pt is an 80 year old admitted from ALF. She presents with episode of confusion and a fall. It is unclear how long she was found on the floor. She has a cough as well as basal crackles. She was also found to be having febrile and her chest x-ray did show possible aspiration. CT of head negative. Question of left CVA with right sided weakness.     Subjective Data  Patient Stated Goal: to go home   Cognition  Cognition Arousal/Alertness: Awake/alert Behavior During Therapy: WFL for tasks assessed/performed Overall Cognitive Status: Impaired/Different from baseline Area of Impairment: Orientation;Attention;Memory;Following commands;Safety/judgement;Awareness;Problem solving Orientation Level: Disoriented to;Place;Time;Situation Current Attention Level: Sustained Memory: Decreased recall of precautions;Decreased short-term memory Following Commands: Follows one step commands with increased time Safety/Judgement: Decreased awareness of safety;Decreased awareness of deficits Awareness: Intellectual Problem Solving: Slow processing;Requires verbal cues General Comments: No family present to determine baseline, however pt stating she is upset about RN giving her medication and that she doesn't believe she has the flu.      Balance  Balance Balance  Assessed: No  End of Session PT - End of Session Equipment Utilized During Treatment: Gait belt Activity Tolerance: Patient tolerated treatment well Patient left: in chair;with call  bell/phone within reach;with chair alarm set Nurse Communication: Mobility status   GP     Sunny Schlein, [place] [phone] 11/19/2013, 2:14 PM

## 2013-11-20 LAB — GLUCOSE, CAPILLARY
GLUCOSE-CAPILLARY: 166 mg/dL — AB (ref 70–99)
Glucose-Capillary: 140 mg/dL — ABNORMAL HIGH (ref 70–99)
Glucose-Capillary: 200 mg/dL — ABNORMAL HIGH (ref 70–99)
Glucose-Capillary: 156 mg/dL — ABNORMAL HIGH (ref 70–99)

## 2013-11-20 MED ORDER — LEVOFLOXACIN IN D5W 750 MG/150ML IV SOLN
750.0000 mg | INTRAVENOUS | Status: DC
  Administered 2013-11-20: 750 mg via INTRAVENOUS
  Filled 2013-11-20 (×2): qty 150

## 2013-11-20 NOTE — Progress Notes (Addendum)
3:04 PM: CSW received call from patient's son stating [facility] and he will be coming to the hospital tomorrow around 3-3:30 pm.  Patient's son expressed that patient has lost her dentures while in the hospital and has mentioned it to the staff.  CSW left voicemail for patient's son for an update about SNF placement. CSW awaiting phone call.   Maree KrabbeLindsay Beyonka Pitney, MSW, Theresia MajorsLCSWA [phone]

## 2013-11-20 NOTE — Progress Notes (Signed)
ANTIBIOTIC CONSULT NOTE - Follow-up  Pharmacy Consult for Vancomycin & zosyn Indication: rule out pneumonia  No Known Allergies  Patient Measurements: Height: 5\' 6"  (167.6 cm) Weight: 179 lb 4.8 oz (81.33 kg) IBW/kg (Calculated) : 59.3 Vital Signs: Temp: 98.4 F (36.9 C) (01/06 0630) Temp src: Oral (01/06 0630) BP: 153/48 mmHg (01/06 0630) Pulse Rate: 62 (01/06 0630) Labs: No results found for this basename: WBC, HGB, PLT, LABCREA, CREATININE,  in the last 72 hours Estimated Creatinine Clearance: 54.3 ml/min (by C-G formula based on Cr of 0.74).  Microbiology: Recent Results (from the past 720 hour(s))  CULTURE, BLOOD (ROUTINE X 2)     Status: None   Collection Time    11/15/13 11:05 PM      Result Value Range Status   Specimen Description BLOOD RIGHT ARM   Final   Special Requests BOTTLES DRAWN AEROBIC AND ANAEROBIC 10CC   Final   Culture  Setup Time     Final   Value: 11/16/2013 03:03     Performed at [facility]   Culture     Final   Value:        BLOOD CULTURE RECEIVED NO GROWTH TO DATE CULTURE WILL BE HELD FOR 5 DAYS BEFORE ISSUING A FINAL NEGATIVE REPORT     Performed at [facility]   Report Status PENDING   Incomplete  CULTURE, BLOOD (ROUTINE X 2)     Status: None   Collection Time    11/15/13 11:05 PM      Result Value Range Status   Specimen Description BLOOD RIGHT HAND   Final   Special Requests BOTTLES DRAWN AEROBIC ONLY 10CC   Final   Culture  Setup Time     Final   Value: 11/16/2013 03:02     Performed at [facility]   Culture     Final   Value:        BLOOD CULTURE RECEIVED NO GROWTH TO DATE CULTURE WILL BE HELD FOR 5 DAYS BEFORE ISSUING A FINAL NEGATIVE REPORT     Performed at [facility]   Report Status PENDING   Incomplete    Assessment: [age] YOF admitted after being found down [facility] her independent [facility]. Pt is positive for flu. She is on tamiflu for this. Today is now day 6 of abx. Consider  transitioning to PO to complete 7 days of abx.  Goal of Therapy:  Vancomycin trough level 15-20 mcg/ml  Plan:  1. Vancomycin to 1250mg  IV Q12H 2. Zosyn 3.375g IV q8  Ulyses SouthwardMinh Nashae Maudlin, PharmD Pager: [phone] 11/20/2013 8:11 AM

## 2013-11-20 NOTE — Progress Notes (Signed)
[facility] PROGRESS NOTE  Carol Santiago ZOX:[id number] DOB: 1925-07-22 DOA: 11/15/2013 PCP: Rene Paci, MD  Brief narrative  80 y/o female with hx of DM, dementia, HTN, hypothyroidism from ALF, admitted with AMS likely in the setting of HCAP, flu and dehydration.   Assessment/Plan:  HCAP/ influnza A  On empiric vanco and zosyn. Afebrile.  Cultures negative  continue tamiflu ( completes 5 day course today)  Switch abx to Levaquin for 1 more day to complete 7 day course   Type 2 DM  on SSI. Monitor Fsg   Hypokalemia  repleted   Hx of CVA  on ASA and plavix. Added statin   Uncontrolled HTN  Stable after increasing  her dose of HCTz-lisinopril. Added prn hydralazine   Dementia  continue aricept   Hypothyroidism  continue synthroid   abdominal pain  Resolved. US liver shows gall stones without cholecystitis. LFTs normal  Code Status: full  Family Communication: updated son [name] over the phone Disposition Plan: SNF tomorrow  Consultants:  none Procedures:  none Antibiotics:  IV vanco and zosyn ( 1/1>>)  HPI/Subjective:  Denies any symptoms   Objective: Filed Vitals:   11/20/13 1400  BP: 162/55  Pulse: 72  Temp: 98.2 F (36.8 C)  Resp: 20    Intake/Output Summary (Last 24 hours) at 11/20/13 1533 Last data filed at 11/20/13 1300  Gross per 24 hour  Intake    240 ml  Output      0 ml  Net    240 ml   Filed Weights   11/15/13 1819 11/15/13 2245  Weight: 82.4 kg (181 lb 10.5 oz) 81.33 kg (179 lb 4.8 oz)    Exam: General: Elderly ferale in NAD  HEENT: no pallor, moist oral mucosa  chest : clear b/l, no added sounds  CVS: NS1&S2, no murmurs, rubs r gallop  Abd: soft, ND, BS+, Nontender Ext: warm, no edema  CNS: AAOX2   Data Reviewed: Basic Metabolic Panel:  Recent Labs Lab 11/15/13 1828 11/16/13 0435 11/17/13 0535  NA 142 141 141  K 3.6* 3.2* 3.6*  CL 102 102 103  CO2 29 28 26   GLUCOSE 205* 151* 212*  BUN 25* 24* 23   CREATININE 0.68 0.71 0.74  CALCIUM 8.4 7.9* 7.9*   Liver Function Tests:  Recent Labs Lab 11/15/13 1828 11/16/13 0435 11/19/13 1140  AST 20 19 26   ALT 14 12 24   ALKPHOS 48 42 42  BILITOT 1.4* 1.1 0.7  PROT 6.6 5.8* 5.8*  ALBUMIN 3.3* 2.7* 2.6*   No results found for this basename: LIPASE, AMYLASE,  in the last 168 hours No results found for this basename: AMMONIA,  in the last 168 hours CBC:  Recent Labs Lab 11/15/13 1828 11/16/13 0435 11/17/13 0535  WBC 6.8 6.0 4.7  NEUTROABS 5.2 4.1  --   HGB 12.0 10.7* 11.2*  HCT 34.9* 32.4* 33.3*  MCV 96.9 99.7 99.7  PLT 181 147* 169   Cardiac Enzymes:  Recent Labs Lab 11/15/13 1828 11/16/13 0435  CKTOTAL  --  258*  TROPONINI <0.30  --    BNP (last 3 results) No results found for this basename: PROBNP,  in the last 8760 hours CBG:  Recent Labs Lab 11/19/13 1137 11/19/13 1645 11/19/13 2307 11/20/13 0630 11/20/13 1146  GLUCAP 227* 213* 160* 140* 200*    Recent Results (from the past 240 hour(s))  CULTURE, BLOOD (ROUTINE X 2)     Status: None   Collection Time    11/15/13  11:05 PM      Result Value Range Status   Specimen Description BLOOD RIGHT ARM   Final   Special Requests BOTTLES DRAWN AEROBIC AND ANAEROBIC 10CC   Final   Culture  Setup Time     Final   Value: 11/16/2013 03:03     Performed at [facility]   Culture     Final   Value:        BLOOD CULTURE RECEIVED NO GROWTH TO DATE CULTURE WILL BE HELD FOR 5 DAYS BEFORE ISSUING A FINAL NEGATIVE REPORT     Performed at [facility]   Report Status PENDING   Incomplete  CULTURE, BLOOD (ROUTINE X 2)     Status: None   Collection Time    11/15/13 11:05 PM      Result Value Range Status   Specimen Description BLOOD RIGHT HAND   Final   Special Requests BOTTLES DRAWN AEROBIC ONLY 10CC   Final   Culture  Setup Time     Final   Value: 11/16/2013 03:02     Performed at [facility]   Culture     Final   Value:        BLOOD CULTURE  RECEIVED NO GROWTH TO DATE CULTURE WILL BE HELD FOR 5 DAYS BEFORE ISSUING A FINAL NEGATIVE REPORT     Performed at [facility]   Report Status PENDING   Incomplete     Studies: [place] Abdomen Complete  11/19/2013   CLINICAL DATA:  Abdominal pain.  History of gallstones.  EXAM: ULTRASOUND ABDOMEN COMPLETE  COMPARISON:  CT abdomen and pelvis 05/23/2007.  FINDINGS: Gallbladder:  Multiple small stones measuring up to approximately 0.3 cm and some sludge are identified within the gallbladder but there is no pericholecystic fluid or wall thickening. The sonographer reports negative Murphy's sign.  Common bile duct:  Diameter: 0.5 cm  Liver:  Demonstrates increased echogenicity consistent with fatty infiltration. No focal lesion or intrahepatic biliary ductal dilatation.  IVC:  No abnormality visualized.  Pancreas:  Visualized portion unremarkable.  Spleen:  Size and appearance within normal limits.  Right Kidney:  Length: 13.5 cm. Echogenicity within normal limits. No mass or hydronephrosis visualized.  Left Kidney:  Length: 12.7 cm. Parapelvic cysts seen on CT scan are not well demonstrated on this examination. No stone or hydronephrosis is identified.  Abdominal aorta:  No aneurysm visualized.  Other findings:  None.  IMPRESSION: Multiple tiny gallstones and sludge are seen within the gallbladder but there is no evidence of cholecystitis.  Fatty infiltration of the liver.   Electronically Signed   By: Drusilla Kannerhomas  Dalessio M.D.   On: 11/19/2013 19:54   Dg Abd Portable 1v  11/19/2013   CLINICAL DATA:  Abdominal pain and distention  EXAM: PORTABLE ABDOMEN - 1 VIEW  COMPARISON:  None.  FINDINGS: The bowel gas pattern is normal. There is a gallstone within the right upper quadrant of the abdomen measuring 1.2 cm.  IMPRESSION: 1. Normal bowel gas pattern. 2. Gallstone.   Electronically Signed   By: Signa Kellaylor  Stroud M.D.   On: 11/19/2013 14:27    Scheduled Meds: . aspirin EC  81 mg Oral Daily  . clopidogrel  75 mg  Oral Q breakfast  . donepezil  5 mg Oral QHS  . heparin  5,000 Units Subcutaneous Q8H  . lisinopril  40 mg Oral Daily   And  . hydrochlorothiazide  25 mg Oral Daily  . insulin aspart  0-15  Units Subcutaneous TID WC  . insulin aspart  0-5 Units Subcutaneous QHS  . levothyroxine  75 mcg Oral QAC breakfast  . oseltamivir  30 mg Oral BID  . piperacillin-tazobactam (ZOSYN)  IV  3.375 g Intravenous Q8H  . simvastatin  40 mg Oral q1800  . vancomycin  1,250 mg Intravenous Q12H   Continuous Infusions:      Time spent: 25 minutes    Otie Headlee  [facility] Pager [phone] If 7PM-7AM, please contact night-coverage at [email or web address], password [facility] 11/20/2013, 3:33 PM  LOS: 5 days

## 2013-11-20 NOTE — Clinical Documentation Improvement (Signed)
  Per Notes patient admitted with "Altered mental status". In the Coding world this term is non-specific and low weighted. If possible, please clarify this term to illustrate this patient's severity of illness and risk of mortality. Thank you.  Possible Clinical Conditions?  - Encephalopathy (describe type if known)                       Septic                       Hypertensive                       Metabolic                       Toxic - Other Condition  Supporting Information: - Influenza A with pneumonia - per ED MD UTI present on admit - dementia, dehydration s/p fall, febrile - patient with "change in mental status", "confusion" - IV Zosyn and Vanc - per Note 1/2:"AMS likely secondary to infection in setting of underlying dementia"   Thank You, Beverley FiedlerLaurie E Sevag Shearn ,RN Clinical Documentation Specialist:  [phone]  [facility]- Health Information Management

## 2013-11-21 DIAGNOSIS — N390 Urinary tract infection, site not specified: Secondary | ICD-10-CM

## 2013-11-21 DIAGNOSIS — J1100 Influenza due to unidentified influenza virus with unspecified type of pneumonia: Principal | ICD-10-CM | POA: Diagnosis present

## 2013-11-21 LAB — BASIC METABOLIC PANEL
BUN: 11 mg/dL (ref 6–23)
CHLORIDE: 102 meq/L (ref 96–112)
CO2: 29 meq/L (ref 19–32)
Calcium: 8.9 mg/dL (ref 8.4–10.5)
Creatinine, Ser: 0.83 mg/dL (ref 0.50–1.10)
GFR calc Af Amer: 72 mL/min — ABNORMAL LOW (ref >90)
GFR calc non Af Amer: 62 mL/min — ABNORMAL LOW (ref >90)
GLUCOSE: 155 mg/dL — AB (ref 70–99)
POTASSIUM: 3.4 meq/L — AB (ref 3.7–5.3)
SODIUM: 142 meq/L (ref 137–147)

## 2013-11-21 LAB — GLUCOSE, CAPILLARY
Glucose-Capillary: 183 mg/dL — ABNORMAL HIGH (ref 70–99)
Glucose-Capillary: 148 mg/dL — ABNORMAL HIGH (ref 70–99)

## 2013-11-21 MED ORDER — AMLODIPINE BESYLATE 5 MG PO TABS: 5.0000 mg | ORAL_TABLET | Freq: Every day | ORAL | Status: DC

## 2013-11-21 MED ORDER — POTASSIUM CHLORIDE CRYS ER 20 MEQ PO TBCR
40.0000 meq | EXTENDED_RELEASE_TABLET | Freq: Once | ORAL | Status: AC
Start: 2013-11-21 — End: 2013-11-21

## 2013-11-21 MED ORDER — LISINOPRIL-HYDROCHLOROTHIAZIDE 20-12.5 MG PO TABS: 2.0000 | ORAL_TABLET | Freq: Every day | ORAL | Status: DC

## 2013-11-21 MED ORDER — POTASSIUM CHLORIDE CRYS ER 20 MEQ PO TBCR
40.0000 meq | EXTENDED_RELEASE_TABLET | Freq: Once | ORAL | Status: AC
  Administered 2013-11-21: 40 meq via ORAL
  Filled 2013-11-21: qty 2

## 2013-11-21 NOTE — Progress Notes (Signed)
OT NOTE  Pt currently d/cing to [facility] with son. OT to defer all further treatment to SNF level care.    Mateo FlowJones, Brynn   OTR/L Pager: [phone] Office: [phone] .

## 2013-11-21 NOTE — Discharge Summary (Addendum)
Physician Discharge Summary  Carol Santiago ZOX:[id number] DOB: May 27, 1925 DOA: 11/15/2013  PCP: Carol PaciValerie Leschber, MD  Admit date: 11/15/2013 Discharge date: 11/21/2013  Time spent: 40 minutes  Recommendations for Outpatient Follow-up:  1. D/C to SNF  Discharge Diagnoses:  Principal Problem:   HCAP (healthcare-associated pneumonia)  Active Problems:   TIA (transient ischemic attack)   Hypothyroid   Dementia   Hypertension   Diabetes mellitus type 2 with neurological manifestations   Influenza with pneumonia   UTI (lower urinary tract infection)   Discharge Condition: FAIR  Diet recommendation: diabetic  Filed Weights   11/15/13 1819 11/15/13 2245  Weight: 82.4 kg (181 lb 10.5 oz) 81.33 kg (179 lb 4.8 oz)    History of present illness:  Please refer to admission H&P for details, but in brief, 80 y/o female with hx of DM, dementia, HTN, hypothyroidism from ALF, admitted with AMS likely in the setting of HCAP, flu and dehydration.    Hospital Course:  HCAP/ influnza A  Placed on  empiric vanco and zosyn. Afebrile.  Cultures negative   completed 5 day course of tamiflu on 1/6  Completes 7 day course of abx today. ( on Levaquin now)  UTI  cx negative. Covered empirically with abx  Type 2 DM  on SSI.   Hypokalemia  repleted   Hx of CVA  on ASA and plavix. Added statin   Uncontrolled HTN  Increased her dose of HCTz-lisinopril. Will add low dose amlodpine  Dementia  continue aricept   Hypothyroidism  continue synthroid   abdominal pain  Resolved. US liver shows gall stones without cholecystitis. LFTs normal   Code Status: full  Family Communication: voice message left to  son Carol Santiago on 1/6 about  hospital course and and plan on d/c today Disposition Plan: SNF    Consultants:  None   Procedures:  None   Antibiotics:  IV vanco and zosyn ( 1/1>>1/6)  levaquin 1/6-1/7  HPI/Subjective:  No overnight issues. Patient feels tired   Discharge  Exam: Filed Vitals:   11/21/13 0506  BP: 171/72  Pulse: 70  Temp: 98.6 F (37 C)  Resp: 20     Discharge Instructions General: Elderly ferale in NAD  HEENT: no pallor, moist oral mucosa  chest : clear b/l, no added sounds  CVS: NS1&S2, no murmurs, rubs r gallop  Abd: soft, ND, BS+, Nontender  Ext: warm, no edema  CNS: AAOX2     Medication List    STOP taking these medications       cefUROXime 250 MG tablet  Commonly known as:  CEFTIN     lisinopril-hydrochlorothiazide 10-12.5 MG per tablet  Commonly known as:  PRINZIDE,ZESTORETIC  Replaced by:  lisinopril-hydrochlorothiazide 20-12.5 MG per tablet      TAKE these medications       amLODipine 5 MG tablet  Commonly known as:  NORVASC  Take 1 tablet (5 mg total) by mouth daily.     aspirin 81 MG tablet  Take 81 mg by mouth daily.     clopidogrel 75 MG tablet  Commonly known as:  PLAVIX  Take 1 tablet (75 mg total) by mouth daily.     donepezil 5 MG tablet  Commonly known as:  ARICEPT  Take 1 tablet (5 mg total) by mouth at bedtime as needed.     glipiZIDE 5 MG tablet  Commonly known as:  GLUCOTROL  Take 2 tablets (10 mg total) by mouth 2 (two) times daily before a meal.  insulin NPH-regular Human (70-30) 100 UNIT/ML injection  Commonly known as:  NOVOLIN 70/30  On hold until further notice     levothyroxine 75 MCG tablet  Commonly known as:  SYNTHROID, LEVOTHROID  Take 75 mcg by mouth daily.     lisinopril-hydrochlorothiazide 20-12.5 MG per tablet  Commonly known as:  ZESTORETIC  Take 2 tablets by mouth daily.     pravastatin 20 MG tablet  Commonly known as:  PRAVACHOL  Take 1 tablet (20 mg total) by mouth daily.     sulindac 200 MG tablet  Commonly known as:  CLINORIL  Take 200 mg by mouth 2 (two) times daily.       No Known Allergies     Follow-up Information   Follow up with Carol Paci, MD In 1 week. (after d/c from SNF)    Specialty:  Internal Medicine   Contact information:    [address] [place] [address] [phone]        The results of significant diagnostics from this hospitalization (including imaging, microbiology, ancillary and laboratory) are listed below for reference.    Significant Diagnostic Studies: Dg Chest 2 View  11/15/2013   CLINICAL DATA:  Code stroke.  Weakness and confusion.  EXAM: CHEST  2 VIEW  COMPARISON:  No priors.  FINDINGS: Bibasilar opacities (left greater than right) may reflect areas of atelectasis and/or consolidation. Probable trace left pleural effusion. Moderate venous congestion, without frank pulmonary edema. Mild cardiomegaly. Upper mediastinal contours are within normal limits. Atherosclerosis in the thoracic aorta.  IMPRESSION: 1. Bibasilar opacities (left greater than right) may reflect areas of atelectasis and/or consolidation. In the setting of suspected stroke, sequela of aspiration is not excluded. 2. Trace left pleural effusion. 3. Cardiomegaly with pulmonary venous congestion. 4. Atherosclerosis.   Electronically Signed   By: Trudie Reed M.D.   On: 11/15/2013 20:15   Ct Head (brain) Wo Contrast  11/15/2013   CLINICAL DATA:  Right side weakness, right facial droop, confusion  EXAM: CT HEAD WITHOUT CONTRAST  TECHNIQUE: Contiguous axial images were obtained from the base of the skull through the vertex without intravenous contrast.  COMPARISON:  5/22/13CT head; 06/14/13 MRI  FINDINGS: No hemorrhage or extra-axial fluid. No hydrocephalus.There is low attenuation in the left frontal lobe subcortical white matter, which is expanded when compared to Apr 05, 2012 but stable when compared to June 14, 2013. There are no other foci of abnormal attenuation to suggest acute infarction. Areas of chronic lacunar infarction bilateral cerebellar hemispheres stable.  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 No acute intracranial findings.  Critical Value/emergent results were called by telephone at the time of interpretation on 11/15/2013 at 6:35 PM to Dr. Cyril Mourning, who verbally acknowledged these results.   Electronically Signed   By: Esperanza Heir M.D.   On: 11/15/2013 18:35   US Abdomen Complete  11/19/2013   CLINICAL DATA:  Abdominal pain.  History of gallstones.  EXAM: ULTRASOUND ABDOMEN COMPLETE  COMPARISON:  CT abdomen and pelvis 05/23/2007.  FINDINGS: Gallbladder:  Multiple small stones measuring up to approximately 0.3 cm and some sludge are identified within the gallbladder but there is no pericholecystic fluid or wall thickening. The sonographer reports negative Murphy's sign.  Common bile duct:  Diameter: 0.5 cm  Liver:  Demonstrates increased echogenicity consistent with fatty infiltration. No focal lesion or intrahepatic biliary ductal dilatation.  IVC:  No abnormality visualized.  Pancreas:  Visualized portion unremarkable.  Spleen:  Size and appearance within normal limits.  Right Kidney:  Length: 13.5 cm. Echogenicity within normal limits. No mass or hydronephrosis visualized.  Left Kidney:  Length: 12.7 cm. Parapelvic cysts seen on CT scan are not well demonstrated on this examination. No stone or hydronephrosis is identified.  Abdominal aorta:  No aneurysm visualized.  Other findings:  None.  IMPRESSION: Multiple tiny gallstones and sludge are seen within the gallbladder but there is no evidence of cholecystitis.  Fatty infiltration of the liver.   Electronically Signed   By: Drusilla Kanner M.D.   On: 11/19/2013 19:54   Dg Abd Portable 1v  11/19/2013   CLINICAL DATA:  Abdominal pain and distention  EXAM: PORTABLE ABDOMEN - 1 VIEW  COMPARISON:  None.  FINDINGS: The bowel gas pattern is normal. There is a gallstone within the right upper quadrant of the abdomen measuring 1.2 cm.  IMPRESSION: 1. Normal bowel gas pattern. 2. Gallstone.   Electronically Signed   By: Signa Kell M.D.   On: 11/19/2013 14:27     Microbiology: Recent Results (from the past 240 hour(s))  CULTURE, BLOOD (ROUTINE X 2)     Status: None   Collection Time    11/15/13 11:05 PM      Result Value Range Status   Specimen Description BLOOD RIGHT ARM   Final   Special Requests BOTTLES DRAWN AEROBIC AND ANAEROBIC 10CC   Final   Culture  Setup Time     Final   Value: 11/16/2013 03:03     Performed at [facility]   Culture     Final   Value:        BLOOD CULTURE RECEIVED NO GROWTH TO DATE CULTURE WILL BE HELD FOR 5 DAYS BEFORE ISSUING A FINAL NEGATIVE REPORT     Performed at [facility]   Report Status PENDING   Incomplete  CULTURE, BLOOD (ROUTINE X 2)     Status: None   Collection Time    11/15/13 11:05 PM      Result Value Range Status   Specimen Description BLOOD RIGHT HAND   Final   Special Requests BOTTLES DRAWN AEROBIC ONLY 10CC   Final   Culture  Setup Time     Final   Value: 11/16/2013 03:02     Performed at [facility]   Culture     Final   Value:        BLOOD CULTURE RECEIVED NO GROWTH TO DATE CULTURE WILL BE HELD FOR 5 DAYS BEFORE ISSUING A FINAL NEGATIVE REPORT     Performed at [facility]   Report Status PENDING   Incomplete     Labs: Basic Metabolic Panel:  Recent Labs Lab 11/15/13 1828 11/16/13 0435 11/17/13 0535 11/21/13 0435  NA 142 141 141 142  K 3.6* 3.2* 3.6* 3.4*  CL 102 102 103 102  CO2 29 28 26 29   GLUCOSE 205* 151* 212* 155*  BUN 25* 24* 23 11  CREATININE 0.68 0.71 0.74 0.83  CALCIUM 8.4 7.9* 7.9* 8.9   Liver Function Tests:  Recent Labs Lab 11/15/13 1828 11/16/13 0435 11/19/13 1140  AST 20 19 26   ALT 14 12 24   ALKPHOS 48 42 42  BILITOT 1.4* 1.1 0.7  PROT 6.6 5.8* 5.8*  ALBUMIN 3.3* 2.7* 2.6*   No results found for this basename: LIPASE, AMYLASE,  in the last 168 hours No results found for this basename: AMMONIA,  in the last 168 hours CBC:  Recent Labs Lab 11/15/13 1828 11/16/13 0435 11/17/13 0535  WBC 6.8 6.0 4.7   NEUTROABS 5.2 4.1  --   HGB 12.0 10.7* 11.2*  HCT 34.9* 32.4* 33.3*  MCV 96.9 99.7 99.7  PLT 181 147* 169   Cardiac Enzymes:  Recent Labs Lab 11/15/13 1828 11/16/13 0435  CKTOTAL  --  258*  TROPONINI <0.30  --    BNP: BNP (last 3 results) No results found for this basename: PROBNP,  in the last 8760 hours CBG:  Recent Labs Lab 11/20/13 0630 11/20/13 1146 11/20/13 1651 11/20/13 2112 11/21/13 0644  GLUCAP 140* 200* 166* 156* 183*       Signed:  Dacen Frayre  [facility] 11/21/2013, 10:19 AM

## 2013-11-21 NOTE — Progress Notes (Signed)
Pt DC'd to SNF via car with son.  DC package given to son.  Assessments and vitals were stable.

## 2013-11-21 NOTE — Progress Notes (Signed)
UR complete.  Ajani Schnieders RN, MSN 

## 2013-11-22 LAB — CULTURE, BLOOD (ROUTINE X 2)
CULTURE: NO GROWTH
CULTURE: NO GROWTH

## 2013-11-25 ENCOUNTER — Encounter (HOSPITAL_BASED_OUTPATIENT_CLINIC_OR_DEPARTMENT_OTHER): Payer: Self-pay | Admitting: Emergency Medicine

## 2013-11-25 ENCOUNTER — Emergency Department (HOSPITAL_BASED_OUTPATIENT_CLINIC_OR_DEPARTMENT_OTHER)
Admission: EM | Admit: 2013-11-25 | Discharge: 2013-11-25 | Disposition: A | Discharge status: Home / Self Care | Payer: Medicare Other | Attending: Emergency Medicine | Admitting: Emergency Medicine

## 2013-11-25 DIAGNOSIS — Z7982 Long term (current) use of aspirin: Secondary | ICD-10-CM | POA: Insufficient documentation

## 2013-11-25 DIAGNOSIS — R21 Rash and other nonspecific skin eruption: Secondary | ICD-10-CM

## 2013-11-25 DIAGNOSIS — IMO0002 Reserved for concepts with insufficient information to code with codable children: Secondary | ICD-10-CM | POA: Insufficient documentation

## 2013-11-25 DIAGNOSIS — Z7902 Long term (current) use of antithrombotics/antiplatelets: Secondary | ICD-10-CM | POA: Insufficient documentation

## 2013-11-25 DIAGNOSIS — Z8709 Personal history of other diseases of the respiratory system: Secondary | ICD-10-CM | POA: Insufficient documentation

## 2013-11-25 DIAGNOSIS — Z79899 Other long term (current) drug therapy: Secondary | ICD-10-CM | POA: Insufficient documentation

## 2013-11-25 DIAGNOSIS — M1990 Unspecified osteoarthritis, unspecified site: Secondary | ICD-10-CM | POA: Insufficient documentation

## 2013-11-25 DIAGNOSIS — E119 Type 2 diabetes mellitus without complications: Secondary | ICD-10-CM | POA: Insufficient documentation

## 2013-11-25 DIAGNOSIS — E039 Hypothyroidism, unspecified: Secondary | ICD-10-CM | POA: Insufficient documentation

## 2013-11-25 DIAGNOSIS — I10 Essential (primary) hypertension: Secondary | ICD-10-CM | POA: Insufficient documentation

## 2013-11-25 DIAGNOSIS — F0390 Unspecified dementia without behavioral disturbance: Secondary | ICD-10-CM | POA: Insufficient documentation

## 2013-11-25 DIAGNOSIS — Z794 Long term (current) use of insulin: Secondary | ICD-10-CM | POA: Insufficient documentation

## 2013-11-25 DIAGNOSIS — Z8701 Personal history of pneumonia (recurrent): Secondary | ICD-10-CM | POA: Insufficient documentation

## 2013-11-25 DIAGNOSIS — Z8673 Personal history of transient ischemic attack (TIA), and cerebral infarction without residual deficits: Secondary | ICD-10-CM | POA: Insufficient documentation

## 2013-11-25 HISTORY — DX: Influenza due to unidentified influenza virus with unspecified type of pneumonia: J11.00

## 2013-11-25 MED ORDER — PREDNISONE 20 MG PO TABS: ORAL_TABLET | ORAL | Status: DC

## 2013-11-25 MED ORDER — LORATADINE 10 MG PO TABS
10.0000 mg | ORAL_TABLET | Freq: Every day | ORAL | Status: DC
Start: 2013-11-25 — End: 2014-07-10

## 2013-11-25 MED ORDER — DIPHENHYDRAMINE HCL 25 MG PO TABS: 50.0000 mg | ORAL_TABLET | ORAL | Status: DC | PRN

## 2013-11-25 MED ORDER — FAMOTIDINE 20 MG PO TABS: 20.0000 mg | ORAL_TABLET | Freq: Two times a day (BID) | ORAL | Status: DC

## 2013-11-25 NOTE — ED Notes (Signed)
Rash x 2 days- [facility] for pneumonia and flu

## 2013-11-25 NOTE — Discharge Instructions (Signed)
Your caregiver has diagnosed you as having a rash possibly from antibiotics or recent illness. If you know what caused the rash, avoid that trigger. Your primary caregiver may recommend that you see an allergy specialist to determine your cause(s). SEEK MEDICAL ATTENTION IF: You still have considerable itching after taking the medication (prescribed or purchased over the counter) for 24 hours.  A temperature above 100.4 develops.  You have any pain or swelling in your joints.  Your hives last more than 1 week.  You develop new and unexplained symptoms (problems). SEEK IMMEDIATE MEDICAL ATTENTION IF: You have swollen lips or tongue.  You have shortness of breath dizziness or altered mental status vomiting or other concerns.

## 2013-11-25 NOTE — ED Provider Notes (Signed)
CSN: [id number]     Arrival date & time 11/25/13  1810 History   First MD Initiated Contact with Patient 11/25/13 1840     Chief Complaint  Patient presents with  . Rash   (Consider location/radiation/quality/duration/timing/severity/associated sxs/prior Treatment) HPI Recent hospitalization for HCAP discharged on Levaquin which she finished, now has 2 days of generalized itching rash which is nonpainful without blistering without bruising without involvement of her eyes or mouth without chest pain shortness breath abdominal pain vomiting or change in mental status she has baseline dementia which is stable there is no treatment prior to arrival her itching is mild to moderate. Past Medical History  Diagnosis Date  . Diabetes mellitus type 2 with neurological manifestations   . Hypertension   . Dementia   . Arthritis   . Hypothyroid   . TIA (transient ischemic attack)   . Osteoarthritis   . Hypokalemia 11/2013  . Pneumonia and influenza    Past Surgical History  Procedure Laterality Date  . Tonsillectomy  1938  . Left knee surgery  2003  . Abdominal hysterectomy     Family History  Problem Relation Age of Onset  . Arthritis Mother   . Stroke Mother   . Hypertension Mother   . Colon cancer Father   . Lung cancer Brother   . Kidney cancer Brother   . Diabetes Maternal Aunt    History  Substance Use Topics  . Smoking status: Never Smoker   . Smokeless tobacco: Never Used  . Alcohol Use: No   OB History   Grav Para Term Preterm Abortions TAB SAB Ect Mult Living                 Review of Systems 10 Systems reviewed and are negative for acute change except as noted in the HPI. Allergies  Review of patient's allergies indicates no known allergies.  Home Medications   Current Outpatient Rx  Name  Route  Sig  Dispense  Refill  . amLODipine (NORVASC) 5 MG tablet   Oral   Take 1 tablet (5 mg total) by mouth daily.   30 tablet   0   . aspirin 81 MG tablet   Oral  Take 81 mg by mouth daily.         . clopidogrel (PLAVIX) 75 MG tablet   Oral   Take 1 tablet (75 mg total) by mouth daily.   90 tablet   3   . diphenhydrAMINE (BENADRYL) 25 MG tablet   Oral   Take 2 tablets (50 mg total) by mouth every 4 (four) hours as needed for itching.   20 tablet   0   . donepezil (ARICEPT) 5 MG tablet   Oral   Take 1 tablet (5 mg total) by mouth at bedtime as needed.   30 tablet   3   . famotidine (PEPCID) 20 MG tablet   Oral   Take 1 tablet (20 mg total) by mouth 2 (two) times daily.   10 tablet   0   . glipiZIDE (GLUCOTROL) 5 MG tablet   Oral   Take 2 tablets (10 mg total) by mouth 2 (two) times daily before a meal.   60 tablet   1   . insulin NPH-regular (NOVOLIN 70/30) (70-30) 100 UNIT/ML injection      On hold until further notice   10 mL      . levothyroxine (SYNTHROID, LEVOTHROID) 75 MCG tablet   Oral   Take 75  mcg by mouth daily.         Marland Kitchen lisinopril-hydrochlorothiazide (ZESTORETIC) 20-12.5 MG per tablet   Oral   Take 2 tablets by mouth daily.   60 tablet   0   . loratadine (CLARITIN) 10 MG tablet   Oral   Take 1 tablet (10 mg total) by mouth daily. One po daily x 5 days   5 tablet   0   . pravastatin (PRAVACHOL) 20 MG tablet   Oral   Take 1 tablet (20 mg total) by mouth daily.   90 tablet   3   . predniSONE (DELTASONE) 20 MG tablet      3 tabs po day one, then 2 tabs daily x 4 days   11 tablet   0   . sulindac (CLINORIL) 200 MG tablet   Oral   Take 200 mg by mouth 2 (two) times daily.          BP 164/76  Pulse 88  Temp(Src) 98.7 F (37.1 C) (Oral)  Resp 18  SpO2 100% Physical Exam  Nursing note and vitals reviewed. Constitutional:  Awake, alert, nontoxic appearance.  HENT:  Head: Atraumatic.  Mouth/Throat: Oropharynx is clear and moist.  Eyes: Conjunctivae are normal. Right eye exhibits no discharge. Left eye exhibits no discharge.  Neck: Neck supple.  Cardiovascular: Normal rate and regular  rhythm.   No murmur heard. Pulmonary/Chest: Effort normal and breath sounds normal. No respiratory distress. She has no wheezes. She has no rales. She exhibits no tenderness.  Abdominal: Soft. There is no tenderness. There is no rebound.  Musculoskeletal: She exhibits no tenderness.  Baseline ROM, no obvious new focal weakness.  Neurological:  Mental status and motor strength appears baseline for patient and situation.  Skin: Rash noted.  Diffuse non-tender erythematous papules and plaques over her entire trunk arms and legs without tenderness petechiae purpura or vesicles  Psychiatric: She has a normal mood and affect.    ED Course  Procedures (including critical care time) Patient / Family / Caregiver informed of clinical course, understand medical decision-making process, and agree with plan. Labs Review Labs Reviewed - No data to display Imaging Review No results found.  EKG Interpretation   None       MDM   1. Rash    I doubt any other EMC precluding discharge at this time including, but not necessarily limited to the following:anaphylaxis, SBI, TEN, SJS.    Hurman Horn, MD 11/26/13 2212

## 2013-11-30 ENCOUNTER — Encounter: Payer: Self-pay | Admitting: Internal Medicine

## 2013-11-30 ENCOUNTER — Ambulatory Visit (INDEPENDENT_AMBULATORY_CARE_PROVIDER_SITE_OTHER): Payer: Medicare Other | Admitting: Internal Medicine

## 2013-11-30 ENCOUNTER — Telehealth: Payer: Self-pay | Admitting: Internal Medicine

## 2013-11-30 VITALS — BP 118/74 | HR 58 | Temp 98.1°F | Wt 182.4 lb

## 2013-11-30 DIAGNOSIS — E1149 Type 2 diabetes mellitus with other diabetic neurological complication: Secondary | ICD-10-CM

## 2013-11-30 DIAGNOSIS — J1100 Influenza due to unidentified influenza virus with unspecified type of pneumonia: Secondary | ICD-10-CM

## 2013-11-30 DIAGNOSIS — F0390 Unspecified dementia without behavioral disturbance: Secondary | ICD-10-CM

## 2013-11-30 MED ORDER — MEMANTINE HCL 5 MG PO TABS: 5.0000 mg | ORAL_TABLET | Freq: Every day | ORAL | Status: DC

## 2013-11-30 NOTE — Assessment & Plan Note (Signed)
We reviewed hypoglycemic events and concern for dementia affecting pt's ability to draw up and administer proper rx 70/30 Change to Pen administration 70/30 and reduce dose of same (40U BID, advise 30 U BID), also reduce OHA dose Consider DC of either med if hypoglycemia persists Reports intol of statin - on ACEI Follows with optho annually Check a1c q3-10974mo and adjust prn Lab Results  Component Value Date   HGBA1C 7.8* 11/15/2013

## 2013-11-30 NOTE — Assessment & Plan Note (Signed)
Hospitalization for same 11/2013 reviewed - Completed antibiotics (rash with Levaquin), pulm symptoms improved

## 2013-11-30 NOTE — Progress Notes (Signed)
Pre-visit discussion using our clinic review tool. No additional management support is needed unless otherwise documented below in the visit note.  

## 2013-11-30 NOTE — Telephone Encounter (Signed)
Pt came to appt see today visit...Carol Santiago/lmb

## 2013-11-30 NOTE — Telephone Encounter (Signed)
I only now saw this message - (1259p) Unable to call prior to 1p because of same If pt unable to come or "no shows", please call to follow up on concerns

## 2013-11-30 NOTE — Patient Instructions (Addendum)
It was good to see you today.  We have reviewed your prior records including labs and tests today from hospital and ER  Medications reviewed and updated - Stop generic Aricept Begin generic Namenda for memory, once daily at this time Your prescription(s) have been submitted to your pharmacy. Please take as directed and contact our office if you believe you are having problem(s) with the medication(s).  Will also refer to physical therapy (As reviewed)  Please schedule followup in 3-4 months to review, call sooner if problems.  Diabetes and Exercise Exercising regularly is important. It is not just about losing weight. It has many health benefits, such as:  Improving your overall fitness, flexibility, and endurance.  Increasing your bone density.  Helping with weight control.  Decreasing your body fat.  Increasing your muscle strength.  Reducing stress and tension.  Improving your overall health. People with diabetes who exercise gain additional benefits because exercise:  Reduces appetite.  Improves the body's use of blood sugar (glucose).  Helps lower or control blood glucose.  Decreases blood pressure.  Helps control blood lipids (such as cholesterol and triglycerides).  Improves the body's use of the hormone insulin by:  Increasing the body's insulin sensitivity.  Reducing the body's insulin needs.  Decreases the risk for heart disease because exercising:  Lowers cholesterol and triglycerides levels.  Increases the levels of good cholesterol (such as high-density lipoproteins [HDL]) in the body.  Lowers blood glucose levels. YOUR ACTIVITY PLAN  Choose an activity that you enjoy and set realistic goals. Your health care provider or diabetes educator can help you make an activity plan that works for you. You can break activities into 2 or 3 sessions throughout the day. Doing so is as good as one long session. Exercise ideas include:  Taking the dog for a  walk.  Taking the stairs instead of the elevator.  Dancing to your favorite song.  Doing your favorite exercise with a friend. RECOMMENDATIONS FOR EXERCISING WITH TYPE 1 OR TYPE 2 DIABETES   Check your blood glucose before exercising. If blood glucose levels are greater than 240 mg/dL, check for urine ketones. Do not exercise if ketones are present.  Avoid injecting insulin into areas of the body that are going to be exercised. For example, avoid injecting insulin into:  The arms when playing tennis.  The legs when jogging.  Keep a record of:  Food intake before and after you exercise.  Expected peak times of insulin action.  Blood glucose levels before and after you exercise.  The type and amount of exercise you have done.  Review your records with your health care provider. Your health care provider will help you to develop guidelines for adjusting food intake and insulin amounts before and after exercising.  If you take insulin or oral hypoglycemic agents, watch for signs and symptoms of hypoglycemia. They include:  Dizziness.  Shaking.  Sweating.  Chills.  Confusion.  Drink plenty of water while you exercise to prevent dehydration or heat stroke. Body water is lost during exercise and must be replaced.  Talk to your health care provider before starting an exercise program to make sure it is safe for you. Remember, almost any type of activity is better than none. Document Released: 01/22/2004 Document Revised: 07/04/2013 Document Reviewed: 04/10/2013 [facility] Patient Information 2014 [place], [place].

## 2013-11-30 NOTE — Progress Notes (Signed)
Subjective:    Patient ID: Carol Santiago, female    DOB: [birth year], 80 y.o.   MRN: [id number]  HPI  Here for hospital followup. Discharge summary reviewed Admit date: 11/15/2013  Discharge date: 11/21/2013  Time spent: 40 minutes  Recommendations for Outpatient Follow-up:  1. D/C to SNF Discharge Diagnoses:  Principal Problem:  HCAP (healthcare-associated pneumonia)  Active Problems:  TIA (transient ischemic attack)  Hypothyroid  Dementia  Hypertension  Diabetes mellitus type 2 with neurological manifestations  Influenza with pneumonia  UTI (lower urinary tract infection)  Since discharge home, has been seen in interim by emergency room for rash, felt to be Levaquin related  Family concerned about increased confusion during illness and hospitalization, now returning to baseline  Past Medical History  Diagnosis Date  . Diabetes mellitus type 2 with neurological manifestations   . Hypertension   . Dementia   . Arthritis   . Hypothyroid   . TIA (transient ischemic attack)   . Osteoarthritis   . Hypokalemia 11/2013  . Pneumonia and influenza    Family History  Problem Relation Age of Onset  . Arthritis Mother   . Stroke Mother   . Hypertension Mother   . Colon cancer Father   . Lung cancer Brother   . Kidney cancer Brother   . Diabetes Maternal Aunt    History  Substance Use Topics  . Smoking status: Never Smoker   . Smokeless tobacco: Never Used  . Alcohol Use: No    Review of Systems  Constitutional: Negative for fever and fatigue.  Respiratory: Negative for cough and shortness of breath.   Cardiovascular: Negative for chest pain and leg swelling.  Psychiatric/Behavioral: Positive for confusion. Negative for behavioral problems and agitation.       Objective:   Physical Exam BP 118/74  Pulse 58  Temp(Src) 98.1 F (36.7 C) (Oral)  Wt 182 lb 6.4 oz (82.736 kg)  SpO2 97%  Wt Readings from Last 3 Encounters:  11/30/13 182 lb 6.4 oz (82.736 kg)    11/15/13 179 lb 4.8 oz (81.33 kg)  05/28/13 188 lb 12.8 oz (85.639 kg)    Constitutional: She appears well-developed and well-nourished. No distress. Son Harvie HeckRandy at side Neck: Normal range of motion. Neck supple. No JVD present. No thyromegaly present.  Cardiovascular: Normal rate, regular rhythm and normal heart sounds.  No murmur heard. No BLE edema. Pulmonary/Chest: Effort normal and breath sounds normal. No respiratory distress. She has no wheezes.  Skin: Macular papular erythema along extremities and trunk, face spared. Improved per family since ER visit. Remaining Skin is warm and dry. no vesicles, ulcers or cellulitis.  Psychiatric: She has a normal mood and affect. Memory recall 1/3. Her behavior is normal but reserved. Judgment impaired.   Lab Results  Component Value Date   WBC 4.7 11/17/2013   HGB 11.2* 11/17/2013   HCT 33.3* 11/17/2013   PLT 169 11/17/2013   GLUCOSE 155* 11/21/2013   CHOL 185 05/28/2013   TRIG 281.0* 05/28/2013   HDL 41.20 05/28/2013   LDLDIRECT 110.3 05/28/2013   LDLCALC 98 03/02/2012   ALT 24 11/19/2013   AST 26 11/19/2013   NA 142 11/21/2013   K 3.4* 11/21/2013   CL 102 11/21/2013   CREATININE 0.83 11/21/2013   BUN 11 11/21/2013   CO2 29 11/21/2013   TSH 1.388 11/16/2013   INR 1.00 11/15/2013   HGBA1C 7.8* 11/15/2013   MICROALBUR 2.9* 02/02/2013       Assessment & Plan:  See problem list. Medications and labs reviewed today.  Time spent with pt/family today 25 minutes, greater than 50% time spent counseling patient on pneumonia, hosp for same, drug rash, dementia and medication review. Also review of prior records

## 2013-11-30 NOTE — Telephone Encounter (Signed)
Pt's son, Harvie HeckRandy, called stated that His mother has an appt today at 1 pm with Dr. Felicity CoyerLeschber. Harvie HeckRandy is concern about Mrs. Carol Santiago driving, he does not think she need to be driving. Randy request to speak to Dr. Felicity Coyerleschber or the assistant before the appt without Mrs. Carol Santiago. Please advise

## 2013-11-30 NOTE — Assessment & Plan Note (Signed)
Exacerbation with sundowning during recent acute illness of pneumonia and hospitalization for same, now back to baseline Follows with neuro (willis) for same Never began aricept as rx'd 07/2012 (per EMR notes) or increase dose 09/2012 as rx'd recommended to start low dose 01/2013, but did not sustain because of diarrhea Will try Namenda instead -slow titrate as tolerated we reviewed potential risk/benefit and possible side effects - pt understands and agrees to same

## 2013-12-03 ENCOUNTER — Telehealth: Payer: Self-pay

## 2013-12-03 NOTE — Telephone Encounter (Signed)
Relevant patient education assigned to patient using Emmi. ° °

## 2014-01-15 ENCOUNTER — Other Ambulatory Visit: Payer: Self-pay | Admitting: Neurology

## 2014-01-15 ENCOUNTER — Other Ambulatory Visit: Payer: Self-pay | Admitting: Internal Medicine

## 2014-01-16 ENCOUNTER — Telehealth: Payer: Self-pay | Admitting: Neurology

## 2014-01-16 NOTE — Telephone Encounter (Signed)
Called patient to schedule appointment with NP, per Dr. Anne HahnWillis, patient's son states that he will talk with her to see if she wants to come in to the office to be seen, he states that her PCP Dr. Rene PaciValerie Leschber has been handling her health problems he states that there has been no new problems neurologically, he will call back if she wants to be seen.

## 2014-01-16 NOTE — Telephone Encounter (Signed)
Message copied by Dan MakerWRIGHT, TASHIA L on Wed Jan 16, 2014 10:37 AM ------      Message from: Stephanie AcreWILLIS, CHARLES      Created: Wed Jan 16, 2014  9:47 AM       This patient has not been seen through our office since November 2013. She will need a revisit, okay to see a Publishing rights managernurse practitioner. ------

## 2014-02-07 ENCOUNTER — Other Ambulatory Visit: Payer: Self-pay | Admitting: Neurology

## 2014-02-07 NOTE — Telephone Encounter (Signed)
See note from earlier this month.  Son said PCP takes care of her medical issues.  Patient has not been seen at our office since 2013.

## 2014-02-13 ENCOUNTER — Ambulatory Visit: Payer: Medicare Other | Admitting: Internal Medicine

## 2014-02-13 ENCOUNTER — Other Ambulatory Visit: Payer: Self-pay | Admitting: *Deleted

## 2014-02-13 MED ORDER — LEVOTHYROXINE SODIUM 75 MCG PO TABS: 75.0000 ug | ORAL_TABLET | Freq: Every day | ORAL | Status: DC

## 2014-02-18 ENCOUNTER — Other Ambulatory Visit (INDEPENDENT_AMBULATORY_CARE_PROVIDER_SITE_OTHER): Payer: Medicare Other

## 2014-02-18 ENCOUNTER — Ambulatory Visit (INDEPENDENT_AMBULATORY_CARE_PROVIDER_SITE_OTHER): Payer: Medicare Other | Admitting: Internal Medicine

## 2014-02-18 ENCOUNTER — Encounter: Payer: Self-pay | Admitting: Internal Medicine

## 2014-02-18 VITALS — BP 130/62 | HR 72 | Temp 98.6°F | Wt 169.8 lb

## 2014-02-18 DIAGNOSIS — E039 Hypothyroidism, unspecified: Secondary | ICD-10-CM

## 2014-02-18 DIAGNOSIS — F0390 Unspecified dementia without behavioral disturbance: Secondary | ICD-10-CM

## 2014-02-18 DIAGNOSIS — E1149 Type 2 diabetes mellitus with other diabetic neurological complication: Secondary | ICD-10-CM

## 2014-02-18 LAB — HEMOGLOBIN A1C: Hgb A1c MFr Bld: 6.5 % (ref 4.6–6.5)

## 2014-02-18 LAB — TSH: TSH: 1.23 u[IU]/mL (ref 0.35–5.50)

## 2014-02-18 NOTE — Progress Notes (Signed)
Pre visit review using our clinic review tool, if applicable. No additional management support is needed unless otherwise documented below in the visit note. 

## 2014-02-18 NOTE — Assessment & Plan Note (Addendum)
We reviewed hypoglycemic events and concern for dementia affecting pt's ability to draw up and administer proper rx 70/30 11/2013 we changed to Pen administration 70/30 and reduce dose of same (40U BID decreased to 30 U BID), but ?if pt taking same (denies any injections) also reduced OHA dose Consider DC of either med if hypoglycemia persists -but pt denies awareness of same Reports intol of statin - on ACEI Follows with optho annually Check a1c q3-1343mo and adjust prn Lab Results  Component Value Date   HGBA1C 7.8* 11/15/2013

## 2014-02-18 NOTE — Assessment & Plan Note (Signed)
Check TSH now and annually, adjust dose as needed Lab Results  Component Value Date   TSH 1.388 11/16/2013

## 2014-02-18 NOTE — Assessment & Plan Note (Signed)
Exacerbation with sundowning during 2014 pneumonia hospitalization, now back to baseline Follows with neuro (willis) prn for same -last OV 2013 Never began aricept as rx'd 07/2012 (per EMR notes) or increase dose 09/2012 as rx'd Initiated low dose 01/2013, but did not sustain because of diarrhea side effects  began Namenda 11/2013- ?if taking same -will need to review with family (not here today) The current medical regimen is effective;  continue present plan and medications.

## 2014-02-18 NOTE — Progress Notes (Signed)
Subjective:    Patient ID: Carol Santiago, female    DOB: 1925-08-28, 80 y.o.   MRN: [id number]  HPI  Patient here today for follow up.  Chronic medical issues reviewed.   [facility]  She is unsure of her medication list.  Asked patient to bring pill bottles with her to next office visit.   Past Medical History  Diagnosis Date  . Diabetes mellitus type 2 with neurological manifestations   . Hypertension   . Dementia   . Arthritis   . Hypothyroid   . TIA (transient ischemic attack)   . Osteoarthritis   . Hypokalemia 11/2013  . Pneumonia and influenza 11/2013    Review of Systems  Constitutional: Positive for fatigue. Negative for fever, chills, activity change and appetite change.  Respiratory: Negative for cough, chest tightness, shortness of breath and wheezing.   Cardiovascular: Negative for chest pain, palpitations and leg swelling.  Gastrointestinal: Positive for diarrhea (occasional). Negative for nausea and vomiting.  Endocrine: Negative for cold intolerance and heat intolerance.  Skin: Negative for rash and wound.  Neurological: Negative for dizziness, syncope and light-headedness.       Objective:   Physical Exam  Vitals reviewed. Constitutional: She is oriented to person, place, and time. She appears well-developed and well-nourished. No distress.  Neck: Normal range of motion. Neck supple. No thyromegaly present.  Cardiovascular: Normal rate, regular rhythm and normal heart sounds.   No murmur heard. Pulmonary/Chest: Effort normal and breath sounds normal. No respiratory distress. She has no wheezes. She exhibits no tenderness.  Abdominal: Soft. Bowel sounds are normal. She exhibits no distension. There is no tenderness.  Lymphadenopathy:    She has no cervical adenopathy.  Neurological: She is alert and oriented to person, place, and time.  Skin: Skin is warm and dry. No rash noted. She is not diaphoretic.    Psychiatric: She has a normal mood and affect. Her behavior is normal. Judgment and thought content normal.    Wt Readings from Last 3 Encounters:  02/18/14 169 lb 12.8 oz (77.021 kg)  11/30/13 182 lb 6.4 oz (82.736 kg)  11/15/13 179 lb 4.8 oz (81.33 kg)   BP Readings from Last 3 Encounters:  02/18/14 130/62  11/30/13 118/74  11/25/13 164/76   Lab Results  Component Value Date   WBC 4.7 11/17/2013   HGB 11.2* 11/17/2013   HCT 33.3* 11/17/2013   PLT 169 11/17/2013   GLUCOSE 155* 11/21/2013   CHOL 185 05/28/2013   TRIG 281.0* 05/28/2013   HDL 41.20 05/28/2013   LDLDIRECT 110.3 05/28/2013   LDLCALC 98 03/02/2012   ALT 24 11/19/2013   AST 26 11/19/2013   NA 142 11/21/2013   K 3.4* 11/21/2013   CL 102 11/21/2013   CREATININE 0.83 11/21/2013   BUN 11 11/21/2013   CO2 29 11/21/2013   TSH 1.388 11/16/2013   INR 1.00 11/15/2013   HGBA1C 7.8* 11/15/2013   MICROALBUR 2.9* 02/02/2013       Assessment & Plan:   Problem List Items Addressed This Visit   Dementia     Exacerbation with sundowning during 2014 pneumonia hospitalization, now back to baseline Follows with neuro (willis) prn for same -last OV 2013 Never began aricept as rx'd 07/2012 (per EMR notes) or increase dose 09/2012 as rx'd Initiated low dose 01/2013, but did not sustain because of diarrhea side effects  began Namenda 11/2013- ?if taking same -will need to review with family (not here today)  The current medical regimen is effective;  continue present plan and medications.     Diabetes mellitus type 2 with neurological manifestations - Primary      We reviewed hypoglycemic events and concern for dementia affecting pt's ability to draw up and administer proper rx 70/30 11/2013 we changed to Pen administration 70/30 and reduce dose of same (40U BID decreased to 30 U BID), but ?if pt taking same (denies any injections) also reduced OHA dose Consider DC of either med if hypoglycemia persists -but pt denies awareness of same Reports intol of statin -  on ACEI Follows with optho annually Check a1c q3-37mo and adjust prn Lab Results  Component Value Date   HGBA1C 7.8* 11/15/2013      Hypothyroid      Check TSH now and annually, adjust dose as needed Lab Results  Component Value Date   TSH 1.388 11/16/2013

## 2014-02-18 NOTE — Patient Instructions (Signed)
It was good to see you today.  We have reviewed your prior records including labs and tests today  Test(s) ordered today. Your results will be released to MyChart (or called to you) after review, usually within 72hours after test completion. If any changes need to be made, you will be notified at that same time.  Medications reviewed and updated, no changes recommended at this time.  Please schedule followup in 6 months for diabetes and medication check, call sooner if problems.

## 2014-02-20 ENCOUNTER — Encounter: Payer: Self-pay | Admitting: *Deleted

## 2014-03-01 ENCOUNTER — Ambulatory Visit: Payer: Medicare Other | Admitting: Internal Medicine

## 2014-04-01 ENCOUNTER — Other Ambulatory Visit: Payer: Self-pay | Admitting: Internal Medicine

## 2014-05-06 ENCOUNTER — Other Ambulatory Visit: Payer: Self-pay | Admitting: Internal Medicine

## 2014-05-20 ENCOUNTER — Other Ambulatory Visit: Payer: Self-pay | Admitting: Internal Medicine

## 2014-06-19 ENCOUNTER — Other Ambulatory Visit: Payer: Self-pay | Admitting: Internal Medicine

## 2014-07-10 ENCOUNTER — Emergency Department (HOSPITAL_COMMUNITY)
Admission: EM | Admit: 2014-07-10 | Discharge: 2014-07-10 | Disposition: A | Discharge status: Home / Self Care | Payer: Medicare Other | Attending: Emergency Medicine | Admitting: Emergency Medicine

## 2014-07-10 ENCOUNTER — Other Ambulatory Visit: Payer: Self-pay | Admitting: Internal Medicine

## 2014-07-10 ENCOUNTER — Encounter (HOSPITAL_COMMUNITY): Payer: Self-pay | Admitting: Emergency Medicine

## 2014-07-10 ENCOUNTER — Emergency Department (HOSPITAL_COMMUNITY): Payer: Medicare Other

## 2014-07-10 DIAGNOSIS — E1149 Type 2 diabetes mellitus with other diabetic neurological complication: Secondary | ICD-10-CM | POA: Diagnosis not present

## 2014-07-10 DIAGNOSIS — Z7982 Long term (current) use of aspirin: Secondary | ICD-10-CM | POA: Insufficient documentation

## 2014-07-10 DIAGNOSIS — S8990XA Unspecified injury of unspecified lower leg, initial encounter: Secondary | ICD-10-CM | POA: Insufficient documentation

## 2014-07-10 DIAGNOSIS — W19XXXA Unspecified fall, initial encounter: Secondary | ICD-10-CM

## 2014-07-10 DIAGNOSIS — M129 Arthropathy, unspecified: Secondary | ICD-10-CM | POA: Diagnosis not present

## 2014-07-10 DIAGNOSIS — F0390 Unspecified dementia without behavioral disturbance: Secondary | ICD-10-CM | POA: Insufficient documentation

## 2014-07-10 DIAGNOSIS — S99929A Unspecified injury of unspecified foot, initial encounter: Principal | ICD-10-CM

## 2014-07-10 DIAGNOSIS — M1990 Unspecified osteoarthritis, unspecified site: Secondary | ICD-10-CM | POA: Insufficient documentation

## 2014-07-10 DIAGNOSIS — I10 Essential (primary) hypertension: Secondary | ICD-10-CM | POA: Insufficient documentation

## 2014-07-10 DIAGNOSIS — Z8701 Personal history of pneumonia (recurrent): Secondary | ICD-10-CM | POA: Diagnosis not present

## 2014-07-10 DIAGNOSIS — W06XXXA Fall from bed, initial encounter: Secondary | ICD-10-CM | POA: Diagnosis not present

## 2014-07-10 DIAGNOSIS — E039 Hypothyroidism, unspecified: Secondary | ICD-10-CM | POA: Diagnosis not present

## 2014-07-10 DIAGNOSIS — Z79899 Other long term (current) drug therapy: Secondary | ICD-10-CM | POA: Insufficient documentation

## 2014-07-10 DIAGNOSIS — Z794 Long term (current) use of insulin: Secondary | ICD-10-CM | POA: Diagnosis not present

## 2014-07-10 DIAGNOSIS — N390 Urinary tract infection, site not specified: Secondary | ICD-10-CM

## 2014-07-10 DIAGNOSIS — Y9389 Activity, other specified: Secondary | ICD-10-CM | POA: Diagnosis not present

## 2014-07-10 DIAGNOSIS — Y9289 Other specified places as the place of occurrence of the external cause: Secondary | ICD-10-CM | POA: Diagnosis not present

## 2014-07-10 DIAGNOSIS — S99919A Unspecified injury of unspecified ankle, initial encounter: Principal | ICD-10-CM

## 2014-07-10 DIAGNOSIS — Z8673 Personal history of transient ischemic attack (TIA), and cerebral infarction without residual deficits: Secondary | ICD-10-CM | POA: Diagnosis not present

## 2014-07-10 DIAGNOSIS — M722 Plantar fascial fibromatosis: Secondary | ICD-10-CM

## 2014-07-10 LAB — URINALYSIS, ROUTINE W REFLEX MICROSCOPIC
BILIRUBIN URINE: NEGATIVE
GLUCOSE, UA: NEGATIVE mg/dL
Ketones, ur: NEGATIVE mg/dL
Nitrite: POSITIVE — AB
PH: 5 (ref 5.0–8.0)
Protein, ur: NEGATIVE mg/dL
Specific Gravity, Urine: 1.02 (ref 1.005–1.030)
Urobilinogen, UA: 0.2 mg/dL (ref 0.0–1.0)

## 2014-07-10 LAB — URINE MICROSCOPIC-ADD ON

## 2014-07-10 MED ORDER — CEPHALEXIN 500 MG PO CAPS
500.0000 mg | ORAL_CAPSULE | Freq: Once | ORAL | Status: AC
  Administered 2014-07-10: 500 mg via ORAL
  Filled 2014-07-10: qty 1

## 2014-07-10 MED ORDER — CEPHALEXIN 500 MG PO CAPS: 500.0000 mg | ORAL_CAPSULE | Freq: Three times a day (TID) | ORAL | Status: DC

## 2014-07-10 NOTE — ED Notes (Signed)
Pt. Had no urine return when a in and out was performed. Will collect when pt. Have to urinate. Nurse was notified.

## 2014-07-10 NOTE — Discharge Instructions (Signed)
You have a urinary tract infection. Take the antibiotics until gone. A urine culture was sent and can be checked in about 2 days to make sure you are on the correct antibiotics. Get the gel foam heel inserts for your left shoe for your plantar fascitis. Return if you get a fever, vomiting or seems worse.

## 2014-07-10 NOTE — ED Provider Notes (Signed)
CSN: [id number]     Arrival date & time 07/10/14  1478 History   First MD Initiated Contact with Patient 07/10/14 [phone]     Chief complaint fall out of bed   (Consider location/radiation/quality/duration/timing/severity/associated sxs/prior Treatment)  Level V caveat for dementia  HPI Patient presents from her nursing facility today after she slid to the floor while eating out of her bed. She states she has pain in her left heel. She seems to related to an injury several years ago when she fell at church. She denies hitting her head. She's unsure if she has an injury. Her main complaint to me is that she had pain in her right leg and now she has pain in her left leg. Her most recent complaint is pain in her heel. Her son is here and states his mother seems to be at her baseline.  PCP Dr Felicity Coyer  Past Medical History  Diagnosis Date  . Diabetes mellitus type 2 with neurological manifestations   . Hypertension   . Dementia   . Arthritis   . Hypothyroid   . TIA (transient ischemic attack)   . Osteoarthritis   . Hypokalemia 11/2013  . Pneumonia and influenza 11/2013   Past Surgical History  Procedure Laterality Date  . Tonsillectomy  1938  . Left knee surgery  2003  . Abdominal hysterectomy     Family History  Problem Relation Age of Onset  . Arthritis Mother   . Stroke Mother   . Hypertension Mother   . Colon cancer Father   . Lung cancer Brother   . Kidney cancer Brother   . Diabetes Maternal Aunt    History  Substance Use Topics  . Smoking status: Never Smoker   . Smokeless tobacco: Never Used  . Alcohol Use: No   Lives in ALF  OB History   Grav Para Term Preterm Abortions TAB SAB Ect Mult Living                 Review of Systems  All other systems reviewed and are negative.     Allergies  Review of patient's allergies indicates no known allergies.  Home Medications   Prior to Admission medications   Medication Sig Start Date End Date Taking?  Authorizing Provider  amLODipine (NORVASC) 5 MG tablet Take 1 tablet (5 mg total) by mouth daily. 11/21/13   Nishant Dhungel, MD  aspirin 81 MG tablet Take 81 mg by mouth daily.    Historical Provider, MD  clopidogrel (PLAVIX) 75 MG tablet TAKE ONE TABLET BY MOUTH ONCE DAILY 05/06/14   Newt Lukes, MD  diphenhydrAMINE (BENADRYL) 25 MG tablet Take 2 tablets (50 mg total) by mouth every 4 (four) hours as needed for itching. 11/25/13   Hurman Horn, MD  famotidine (PEPCID) 20 MG tablet Take 1 tablet (20 mg total) by mouth 2 (two) times daily. 11/25/13   Hurman Horn, MD  glipiZIDE (GLUCOTROL) 5 MG tablet TAKE ONE TABLET BY MOUTH TWICE DAILY BEFORE MEAL(S) 06/19/14   Newt Lukes, MD  insulin NPH-regular (NOVOLIN 70/30) (70-30) 100 UNIT/ML injection On hold until further notice 09/24/13   Newt Lukes, MD  levothyroxine (SYNTHROID, LEVOTHROID) 75 MCG tablet TAKE ONE TABLET BY MOUTH ONCE DAILY 04/01/14   Newt Lukes, MD  lisinopril-hydrochlorothiazide (PRINZIDE,ZESTORETIC) 10-12.5 MG per tablet TAKE ONE TABLET BY MOUTH ONCE DAILY 05/20/14   Newt Lukes, MD  lisinopril-hydrochlorothiazide (ZESTORETIC) 20-12.5 MG per tablet Take 2 tablets by mouth daily.  11/21/13   Nishant Dhungel, MD  loratadine (CLARITIN) 10 MG tablet Take 1 tablet (10 mg total) by mouth daily. One po daily x 5 days 11/25/13   Hurman Horn, MD  memantine (NAMENDA) 5 MG tablet Take 1 tablet (5 mg total) by mouth daily. 11/30/13   Newt Lukes, MD  pravastatin (PRAVACHOL) 20 MG tablet TAKE ONE TABLET BY MOUTH ONCE DAILY 05/20/14   Newt Lukes, MD  sulindac (CLINORIL) 200 MG tablet Take 200 mg by mouth 2 (two) times daily.    Historical Provider, MD   BP 135/51  Pulse 71  Temp(Src) 99.3 F (37.4 C) (Oral)  Resp 16  SpO2 96%  Vital signs normal   Physical Exam  Nursing note and vitals reviewed. Constitutional: She appears well-developed and well-nourished.  Non-toxic appearance. She does not appear  ill. No distress.  HENT:  Head: Normocephalic and atraumatic.  Right Ear: External ear normal.  Left Ear: External ear normal.  Nose: Nose normal. No mucosal edema or rhinorrhea.  Mouth/Throat: Oropharynx is clear and moist and mucous membranes are normal. No dental abscesses or uvula swelling.  Eyes: Conjunctivae and EOM are normal. Pupils are equal, round, and reactive to light.  Neck: Normal range of motion and full passive range of motion without pain. Neck supple.  Cardiovascular: Normal rate, regular rhythm and normal heart sounds.  Exam reveals no gallop and no friction rub.   No murmur heard. Pulmonary/Chest: Effort normal and breath sounds normal. No respiratory distress. She has no wheezes. She has no rhonchi. She has no rales. She exhibits no tenderness and no crepitus.  Abdominal: Soft. Normal appearance and bowel sounds are normal. She exhibits no distension. There is no tenderness. There is no rebound and no guarding.  Musculoskeletal: Normal range of motion. She exhibits no edema and no tenderness.  Moves all extremities well. Has some mild tenderness at the insertion of the Achilles on the left heel. There's no bruising or swelling seen of her extremities.  Neurological: She is alert. She has normal strength. No cranial nerve deficit.  Skin: Skin is warm, dry and intact. No rash noted. No erythema. No pallor.  No bruising seen on her trunk or her extremities  Psychiatric: She has a normal mood and affect. Her speech is normal and behavior is normal. Her mood appears not anxious.    ED Course  Procedures (including critical care time)  Medications  cephALEXin (KEFLEX) capsule 500 mg (not administered)   Discussed xray results with son and patient. He is going to get her heel gel pads to put in her shoes.   Pt started on meds for her UTI.   Labs Review Results for orders placed during the hospital encounter of 07/10/14  URINALYSIS, ROUTINE W REFLEX MICROSCOPIC       Result Value Ref Range   Color, Urine AMBER (*) YELLOW   APPearance TURBID (*) CLEAR   Specific Gravity, Urine 1.020  1.005 - 1.030   pH 5.0  5.0 - 8.0   Glucose, UA NEGATIVE  NEGATIVE mg/dL   Hgb urine dipstick TRACE (*) NEGATIVE   Bilirubin Urine NEGATIVE  NEGATIVE   Ketones, ur NEGATIVE  NEGATIVE mg/dL   Protein, ur NEGATIVE  NEGATIVE mg/dL   Urobilinogen, UA 0.2  0.0 - 1.0 mg/dL   Nitrite POSITIVE (*) NEGATIVE   Leukocytes, UA MODERATE (*) NEGATIVE  URINE MICROSCOPIC-ADD ON      Result Value Ref Range   Squamous Epithelial / LPF FEW (*)  RARE   WBC, UA 7-10  <3 WBC/hpf   RBC / HPF 0-2  <3 RBC/hpf   Bacteria, UA MANY (*) RARE   CBG 123 per nursing staff    Imaging Review Dg Os Calcis Left  07/10/2014   CLINICAL DATA:  Pain  EXAM: LEFT OS CALCIS - 2+ VIEW  COMPARISON:  None.  FINDINGS: Lateral and Harris views were obtained. There is no fracture or dislocation. Joint spaces appear intact. There is a minimal posterior calcaneal spur. Incidental note is made of spurring in the dorsal midfoot.  IMPRESSION: Small posterior calcaneal spur. No fracture. Joint spaces in the calcaneal region overall appear intact. Incidental note is made of spurring in the dorsal midfoot.   Electronically Signed   By: Bretta Bang M.D.   On: 07/10/2014 10:30   Dg Foot Complete Left  07/10/2014   CLINICAL DATA:  80 year old female with pain. Initial encounter.  EXAM: LEFT FOOT - COMPLETE 3+ VIEW  COMPARISON:  None.  FINDINGS: Bone mineralization is within normal limits for age. Joint spaces within normal limits for age. Alignment within normal limits. No acute osseous abnormality identified.  IMPRESSION: No acute osseous abnormality identified at the left foot.   Electronically Signed   By: Augusto Gamble M.D.   On: 07/10/2014 10:30     EKG Interpretation None      MDM   Final diagnoses:  Fall, initial encounter  Urinary tract infection without hematuria, site unspecified  Plantar fasciitis of left  foot     New Prescriptions   CEPHALEXIN (KEFLEX) 500 MG CAPSULE    Take 1 capsule (500 mg total) by mouth 3 (three) times daily.    Plan discharge  Devoria Albe, MD, Franz Dell, MD 07/10/14 [id number]

## 2014-07-10 NOTE — ED Notes (Signed)
Patient is from [facility] assistant living. Patient slide into the floor from bed was found just sitting there. Patient c/o of fatigue and weakness. Per EMS patient had decreased muscle tone when trying to get patient from floor to stretcher. Patient does c/o pain in lower extremities, which is not new pain.

## 2014-07-11 LAB — CBG MONITORING, ED: Glucose-Capillary: 120 mg/dL — ABNORMAL HIGH (ref 70–99)

## 2014-07-12 LAB — URINE CULTURE
Colony Count: >=100000
SPECIAL REQUESTS: NORMAL

## 2014-10-02 ENCOUNTER — Encounter: Payer: Self-pay | Admitting: Neurology

## 2014-10-04 ENCOUNTER — Other Ambulatory Visit: Payer: Self-pay | Admitting: Internal Medicine

## 2014-10-08 ENCOUNTER — Encounter: Payer: Self-pay | Admitting: Neurology

## 2014-10-12 ENCOUNTER — Other Ambulatory Visit: Payer: Self-pay | Admitting: Internal Medicine

## 2014-12-19 ENCOUNTER — Other Ambulatory Visit: Payer: Self-pay | Admitting: Internal Medicine

## 2015-01-19 ENCOUNTER — Other Ambulatory Visit: Payer: Self-pay | Admitting: Internal Medicine

## 2015-02-12 ENCOUNTER — Other Ambulatory Visit: Payer: Self-pay | Admitting: Internal Medicine

## 2015-03-18 ENCOUNTER — Other Ambulatory Visit: Payer: Self-pay | Admitting: Internal Medicine

## 2015-03-18 NOTE — Telephone Encounter (Signed)
Advised patients son rx has been called in

## 2015-04-23 ENCOUNTER — Other Ambulatory Visit: Payer: Self-pay | Admitting: Internal Medicine

## 2015-04-25 ENCOUNTER — Telehealth: Payer: Self-pay | Admitting: Internal Medicine

## 2015-04-25 MED ORDER — LEVOTHYROXINE SODIUM 75 MCG PO TABS
75.0000 ug | ORAL_TABLET | Freq: Every day | ORAL | Status: DC
Start: 2015-04-25 — End: 2015-05-26

## 2015-04-25 MED ORDER — LISINOPRIL-HYDROCHLOROTHIAZIDE 10-12.5 MG PO TABS: 1.0000 | ORAL_TABLET | Freq: Every day | ORAL | Status: DC

## 2015-04-25 NOTE — Telephone Encounter (Signed)
Sent 30 day on pt levothyoxine 75 mg & lisinopril until appt 05/09/15...Raechel Chute

## 2015-04-25 NOTE — Telephone Encounter (Signed)
Patient's son [name] called and we made an appointment for a physical on 6/24. She only has a few pills left of her medication. Can we go ahead and do a month prescription till we get her in. Pharmacy is [facility] [address]

## 2015-04-25 NOTE — Addendum Note (Signed)
Addended by: Deatra James on: 04/25/2015 11:56 AM   Modules accepted: Orders

## 2015-05-09 ENCOUNTER — Other Ambulatory Visit (INDEPENDENT_AMBULATORY_CARE_PROVIDER_SITE_OTHER): Payer: Medicare Other

## 2015-05-09 ENCOUNTER — Ambulatory Visit (INDEPENDENT_AMBULATORY_CARE_PROVIDER_SITE_OTHER): Payer: Medicare Other | Admitting: Internal Medicine

## 2015-05-09 ENCOUNTER — Encounter: Payer: Self-pay | Admitting: Internal Medicine

## 2015-05-09 VITALS — BP 120/62 | HR 78 | Temp 98.6°F | Resp 16 | Ht 66.0 in | Wt 175.1 lb

## 2015-05-09 DIAGNOSIS — Z0000 Encounter for general adult medical examination without abnormal findings: Secondary | ICD-10-CM | POA: Diagnosis not present

## 2015-05-09 DIAGNOSIS — F0390 Unspecified dementia without behavioral disturbance: Secondary | ICD-10-CM

## 2015-05-09 DIAGNOSIS — E1149 Type 2 diabetes mellitus with other diabetic neurological complication: Secondary | ICD-10-CM

## 2015-05-09 DIAGNOSIS — E119 Type 2 diabetes mellitus without complications: Secondary | ICD-10-CM | POA: Diagnosis not present

## 2015-05-09 DIAGNOSIS — E039 Hypothyroidism, unspecified: Secondary | ICD-10-CM | POA: Diagnosis not present

## 2015-05-09 DIAGNOSIS — I10 Essential (primary) hypertension: Secondary | ICD-10-CM

## 2015-05-09 DIAGNOSIS — G459 Transient cerebral ischemic attack, unspecified: Secondary | ICD-10-CM

## 2015-05-09 LAB — HEMOGLOBIN A1C: HEMOGLOBIN A1C: 6.3 % (ref 4.6–6.5)

## 2015-05-09 LAB — COMPREHENSIVE METABOLIC PANEL
ALBUMIN: 3.9 g/dL (ref 3.5–5.2)
ALK PHOS: 63 U/L (ref 39–117)
ALT: 13 U/L (ref 0–35)
AST: 17 U/L (ref 0–37)
BUN: 34 mg/dL — ABNORMAL HIGH (ref 6–23)
CALCIUM: 9.1 mg/dL (ref 8.4–10.5)
CO2: 30 mEq/L (ref 19–32)
Chloride: 103 mEq/L (ref 96–112)
Creatinine, Ser: 0.77 mg/dL (ref 0.40–1.20)
GFR: 75.16 mL/min (ref >60.00)
GLUCOSE: 157 mg/dL — AB (ref 70–99)
POTASSIUM: 3.7 meq/L (ref 3.5–5.1)
Sodium: 139 mEq/L (ref 135–145)
TOTAL PROTEIN: 6.9 g/dL (ref 6.0–8.3)
Total Bilirubin: 1 mg/dL (ref 0.2–1.2)

## 2015-05-09 LAB — TSH: TSH: 1.54 u[IU]/mL (ref 0.35–4.50)

## 2015-05-09 NOTE — Assessment & Plan Note (Signed)
Does not need aspirin and plavix so will stop the plavix and continue on 81mg  ASA only.

## 2015-05-09 NOTE — Progress Notes (Signed)
   Subjective:    Patient ID: Carol Santiago, female    DOB: 1925-04-15, 81 y.o.   MRN: [id number]  HPI Here for medicare wellness, no new complaints. Please see A/P for status and treatment of chronic medical problems.   Diet: mediocre Physical activity: sedentary Depression/mood screen: negative Hearing: intact to whispered voice Visual acuity: grossly normal, performs annual eye exam  ADLs: lives in [facility] and gets assistance Fall risk: medium, walker to walk Home safety: mediocre Cognitive evaluation: intact to naming, remote recall, recent recall poor EOL planning: adv directives discussed  I have personally reviewed and have noted 1. The patient's medical and social history - reviewed today no changes 2. Their use of alcohol, tobacco or illicit drugs 3. Their current medications and supplements 4. The patient's functional ability including ADL's, fall risks, home safety risks and hearing or visual impairment. 5. Diet and physical activities 6. Evidence for depression or mood disorders 7. Care team reviewed and updated (available in snapshot)  Review of Systems  Constitutional: Negative for fever, activity change, appetite change and fatigue.  HENT: Negative.   Respiratory: Negative for cough, chest tightness, shortness of breath and wheezing.   Cardiovascular: Negative for chest pain, palpitations and leg swelling.  Gastrointestinal: Negative for abdominal pain, diarrhea, constipation, blood in stool and abdominal distention.  Musculoskeletal: Positive for gait problem. Negative for myalgias, back pain and arthralgias.  Neurological: Positive for weakness. Negative for dizziness, syncope and numbness.  Psychiatric/Behavioral: Negative.       Objective:   Physical Exam  Constitutional: She is oriented to person, place, and time. She appears well-developed and well-nourished.  HENT:  Head: Normocephalic and atraumatic.  Eyes: EOM are normal.  Neck: Normal  range of motion.  Cardiovascular: Normal rate and regular rhythm.   Pulmonary/Chest: Effort normal and breath sounds normal. No respiratory distress. She has no wheezes. She has no rales.  Abdominal: Soft. She exhibits no distension. There is no tenderness. There is no rebound.  Musculoskeletal: She exhibits no edema.  Neurological: She is alert and oriented to person, place, and time. Coordination normal.  Skin: Skin is warm and dry.  Psychiatric: She has a normal mood and affect.   Filed Vitals:   05/09/15 0834 05/09/15 0917  BP: 140/60 120/62  Pulse: 78   Temp: 98.6 F (37 C)   TempSrc: Oral   Resp: 16   Height: 5\' 6"  (1.676 m)   Weight: 175 lb 1.9 oz (79.434 kg)   SpO2: 97%       Assessment & Plan:

## 2015-05-09 NOTE — Assessment & Plan Note (Signed)
Verified medications with pharmacy and controlled on lisinopril/hctz 10/12.5 daily. Checking CMP today.

## 2015-05-09 NOTE — Assessment & Plan Note (Signed)
The son has concerns but he is not able to say what his concerns are but he wants her to have memory testing. Will have her return for evaluation. She is not taking namenda any longer and have removed from list.

## 2015-05-09 NOTE — Assessment & Plan Note (Signed)
Check TSH and adjust dosing as needed.  

## 2015-05-09 NOTE — Assessment & Plan Note (Signed)
Given some recent confusion with meds (taking multiple at once of her glipizide) would like to stop if able. Will check HgA1c today and kidney function. If HgA1c still lower will trial her off glipizide. Foot exam documented today. No complications new and neuropathy is stable.

## 2015-05-09 NOTE — Assessment & Plan Note (Signed)
Not a candidate for further screening with mammogram or colonoscopy. Not sure if she has had immunizations and she and her son cannot recall. Will review records. She does not want shots today. Talked to her about screening recommendations and information given to her today. Talked to them about fall safety and recommendation for her not to drive today.

## 2015-05-09 NOTE — Progress Notes (Signed)
Pre visit review using our clinic review tool, if applicable. No additional management support is needed unless otherwise documented below in the visit note. 

## 2015-05-09 NOTE — Patient Instructions (Addendum)
We are going to work to decrease the medicines you are taking everyday.   We would like you to stop: 1. Plavix  Keep taking:  1. Pravastatin (1 per day) 2. Lisinopril/hctz (1 per day) 3. Aspirin 81 mg (1 per day) 4. Levothyroxine (1 per day)  Once the labs are back we may need to stop: 1. Glipizide  We do recommend that you come back for a visit to talk about the memory and see if we think that is still a problem.   Come back in 6 months for a check up.

## 2015-05-15 ENCOUNTER — Telehealth: Payer: Self-pay | Admitting: Internal Medicine

## 2015-05-15 NOTE — Telephone Encounter (Signed)
Patient son is following up on patient medication. Please advise

## 2015-05-16 NOTE — Telephone Encounter (Signed)
Patient's son said you mentioned at the last office visit that you may take the patient off her thyroid medication. She already stopped the glipizide as you advised. He just wants to know if she should continue taking her levothyroxine. Her levels were normal on her last lab report.

## 2015-05-16 NOTE — Telephone Encounter (Signed)
Spoke with patient' s son and patient will continue to take the thyroid medication.

## 2015-05-16 NOTE — Telephone Encounter (Signed)
No, she needs to keep taking the thyroid medicine.

## 2015-05-26 ENCOUNTER — Other Ambulatory Visit: Payer: Self-pay | Admitting: Internal Medicine

## 2015-05-26 ENCOUNTER — Other Ambulatory Visit: Payer: Self-pay

## 2015-05-26 MED ORDER — LEVOTHYROXINE SODIUM 75 MCG PO TABS: 75.0000 ug | ORAL_TABLET | Freq: Every day | ORAL | Status: DC

## 2015-05-26 MED ORDER — CLOPIDOGREL BISULFATE 75 MG PO TABS: 75.0000 mg | ORAL_TABLET | Freq: Every day | ORAL | Status: DC

## 2015-05-26 MED ORDER — PRAVASTATIN SODIUM 20 MG PO TABS: 20.0000 mg | ORAL_TABLET | Freq: Every day | ORAL | Status: AC

## 2015-10-28 ENCOUNTER — Other Ambulatory Visit (INDEPENDENT_AMBULATORY_CARE_PROVIDER_SITE_OTHER): Payer: Medicare Other

## 2015-10-28 ENCOUNTER — Encounter: Payer: Self-pay | Admitting: Internal Medicine

## 2015-10-28 ENCOUNTER — Ambulatory Visit (INDEPENDENT_AMBULATORY_CARE_PROVIDER_SITE_OTHER): Payer: Medicare Other | Admitting: Internal Medicine

## 2015-10-28 VITALS — BP 128/70 | HR 86 | Temp 98.2°F | Resp 12 | Ht 66.0 in | Wt 171.0 lb

## 2015-10-28 DIAGNOSIS — E1149 Type 2 diabetes mellitus with other diabetic neurological complication: Secondary | ICD-10-CM

## 2015-10-28 DIAGNOSIS — B369 Superficial mycosis, unspecified: Secondary | ICD-10-CM | POA: Diagnosis not present

## 2015-10-28 DIAGNOSIS — E119 Type 2 diabetes mellitus without complications: Secondary | ICD-10-CM | POA: Diagnosis not present

## 2015-10-28 DIAGNOSIS — Z23 Encounter for immunization: Secondary | ICD-10-CM | POA: Diagnosis not present

## 2015-10-28 DIAGNOSIS — F0390 Unspecified dementia without behavioral disturbance: Secondary | ICD-10-CM

## 2015-10-28 DIAGNOSIS — E039 Hypothyroidism, unspecified: Secondary | ICD-10-CM

## 2015-10-28 LAB — COMPREHENSIVE METABOLIC PANEL
ALT: 10 U/L (ref 0–35)
AST: 14 U/L (ref 0–37)
Albumin: 3.9 g/dL (ref 3.5–5.2)
Alkaline Phosphatase: 59 U/L (ref 39–117)
BILIRUBIN TOTAL: 1.2 mg/dL (ref 0.2–1.2)
BUN: 26 mg/dL — ABNORMAL HIGH (ref 6–23)
CO2: 31 meq/L (ref 19–32)
Calcium: 9.5 mg/dL (ref 8.4–10.5)
Chloride: 98 mEq/L (ref 96–112)
Creatinine, Ser: 0.84 mg/dL (ref 0.40–1.20)
GFR: 67.91 mL/min (ref >60.00)
GLUCOSE: 368 mg/dL — AB (ref 70–99)
Potassium: 4.3 mEq/L (ref 3.5–5.1)
Sodium: 137 mEq/L (ref 135–145)
Total Protein: 6.7 g/dL (ref 6.0–8.3)

## 2015-10-28 LAB — HEMOGLOBIN A1C: Hgb A1c MFr Bld: 9.7 % — ABNORMAL HIGH (ref 4.6–6.5)

## 2015-10-28 LAB — TSH: TSH: 1.64 u[IU]/mL (ref 0.35–4.50)

## 2015-10-28 MED ORDER — NYSTATIN 100000 UNIT/GM EX CREA: 1.0000 "application " | TOPICAL_CREAM | Freq: Two times a day (BID) | CUTANEOUS | Status: DC

## 2015-10-28 NOTE — Progress Notes (Signed)
Pre visit review using our clinic review tool, if applicable. No additional management support is needed unless otherwise documented below in the visit note. 

## 2015-10-28 NOTE — Progress Notes (Signed)
   Subjective:    Patient ID: Carol Santiago, female    DOB: 02/09/1925, 81 y.o.   MRN: [id number]  HPI The patient is an 81 YO female coming in for rash under her left breast. She has had it for some time. Not sure how long. She is concerned that it is cancer. Not painful, and she is not sure if it is itchy.  Having some more problems with her memory but not sure she wants to do anything about it. Still living at ALF [facility]. Is able to remember her lunch friends most of the time. Sometimes forgets what day it is as there is not too much to help her remember.   Review of Systems  Constitutional: Negative for fever, activity change, appetite change and fatigue.  HENT: Negative.   Respiratory: Negative for cough, chest tightness, shortness of breath and wheezing.   Cardiovascular: Negative for chest pain, palpitations and leg swelling.  Gastrointestinal: Negative for abdominal pain, diarrhea, constipation, blood in stool and abdominal distention.  Musculoskeletal: Positive for gait problem. Negative for myalgias, back pain and arthralgias.  Skin: Positive for rash.  Neurological: Positive for weakness. Negative for dizziness, syncope and numbness.       Memory change  Psychiatric/Behavioral: Negative.       Objective:   Physical Exam  Constitutional: She is oriented to person, place, and time. She appears well-developed and well-nourished.  HENT:  Head: Normocephalic and atraumatic.  Eyes: EOM are normal.  Neck: Normal range of motion.  Cardiovascular: Normal rate and regular rhythm.   Pulmonary/Chest: Effort normal and breath sounds normal. No respiratory distress. She has no wheezes. She has no rales.  Abdominal: Soft. She exhibits no distension. There is no tenderness. There is no rebound.  Musculoskeletal: She exhibits no edema.  Neurological: She is alert and oriented to person, place, and time. Coordination normal.  Skin: Skin is warm and dry.  Yeast skin infection  under the left breast, without lumps or masses. Some satellite lesions.   Psychiatric: She has a normal mood and affect.   Filed Vitals:   10/28/15 1338  BP: 158/74  Pulse: 86  Temp: 98.2 F (36.8 C)  TempSrc: Oral  Resp: 12  Height: 5\' 6"  (1.676 m)  Weight: 171 lb (77.565 kg)  SpO2: 97%      Assessment & Plan:  Prevnar 13 given at visit.

## 2015-10-28 NOTE — Assessment & Plan Note (Signed)
Stopped glipizide after last visit. Rechecking HgA1c today and adjust as needed. Not on medications today.

## 2015-10-28 NOTE — Patient Instructions (Signed)
We have given you the pneumonia shot today.   We are checking the labs and will call you with the results.   We have sent in a cream to use under your breast twice a day until it is gone. This is not cancer.

## 2015-10-30 ENCOUNTER — Other Ambulatory Visit: Payer: Self-pay | Admitting: Internal Medicine

## 2015-10-30 DIAGNOSIS — B369 Superficial mycosis, unspecified: Secondary | ICD-10-CM | POA: Insufficient documentation

## 2015-10-30 MED ORDER — METFORMIN HCL 500 MG PO TABS: 500.0000 mg | ORAL_TABLET | Freq: Every day | ORAL | Status: DC

## 2015-10-30 NOTE — Assessment & Plan Note (Signed)
Checking TSH and adjust as needed. Currently on synthroid 75 mcg daily.  

## 2015-10-30 NOTE — Assessment & Plan Note (Signed)
Rx for nystatin cream for large fungal infection under the left breast.

## 2015-10-30 NOTE — Assessment & Plan Note (Signed)
Suspect that her memory is worsening but she does not want to do anything. Asked her to bring a family member to next visit and she will consider that.

## 2016-01-22 ENCOUNTER — Emergency Department (HOSPITAL_COMMUNITY): Payer: Medicare Other

## 2016-01-22 ENCOUNTER — Encounter (HOSPITAL_COMMUNITY): Payer: Self-pay | Admitting: Emergency Medicine

## 2016-01-22 ENCOUNTER — Emergency Department (HOSPITAL_COMMUNITY)
Admission: EM | Admit: 2016-01-22 | Discharge: 2016-01-22 | Disposition: A | Discharge status: Home / Self Care | Payer: Medicare Other | Attending: Emergency Medicine | Admitting: Emergency Medicine

## 2016-01-22 DIAGNOSIS — Y9389 Activity, other specified: Secondary | ICD-10-CM | POA: Diagnosis not present

## 2016-01-22 DIAGNOSIS — Z7902 Long term (current) use of antithrombotics/antiplatelets: Secondary | ICD-10-CM | POA: Insufficient documentation

## 2016-01-22 DIAGNOSIS — Z8673 Personal history of transient ischemic attack (TIA), and cerebral infarction without residual deficits: Secondary | ICD-10-CM | POA: Insufficient documentation

## 2016-01-22 DIAGNOSIS — S8992XA Unspecified injury of left lower leg, initial encounter: Secondary | ICD-10-CM | POA: Diagnosis not present

## 2016-01-22 DIAGNOSIS — S3993XA Unspecified injury of pelvis, initial encounter: Secondary | ICD-10-CM | POA: Diagnosis not present

## 2016-01-22 DIAGNOSIS — Z7984 Long term (current) use of oral hypoglycemic drugs: Secondary | ICD-10-CM | POA: Insufficient documentation

## 2016-01-22 DIAGNOSIS — Y998 Other external cause status: Secondary | ICD-10-CM | POA: Insufficient documentation

## 2016-01-22 DIAGNOSIS — N390 Urinary tract infection, site not specified: Secondary | ICD-10-CM | POA: Diagnosis not present

## 2016-01-22 DIAGNOSIS — E119 Type 2 diabetes mellitus without complications: Secondary | ICD-10-CM | POA: Diagnosis not present

## 2016-01-22 DIAGNOSIS — I10 Essential (primary) hypertension: Secondary | ICD-10-CM | POA: Diagnosis not present

## 2016-01-22 DIAGNOSIS — S8991XA Unspecified injury of right lower leg, initial encounter: Secondary | ICD-10-CM | POA: Insufficient documentation

## 2016-01-22 DIAGNOSIS — E039 Hypothyroidism, unspecified: Secondary | ICD-10-CM | POA: Insufficient documentation

## 2016-01-22 DIAGNOSIS — S79911A Unspecified injury of right hip, initial encounter: Secondary | ICD-10-CM | POA: Insufficient documentation

## 2016-01-22 DIAGNOSIS — Z8701 Personal history of pneumonia (recurrent): Secondary | ICD-10-CM | POA: Insufficient documentation

## 2016-01-22 DIAGNOSIS — M25559 Pain in unspecified hip: Secondary | ICD-10-CM

## 2016-01-22 DIAGNOSIS — Y9289 Other specified places as the place of occurrence of the external cause: Secondary | ICD-10-CM | POA: Diagnosis not present

## 2016-01-22 DIAGNOSIS — Z7982 Long term (current) use of aspirin: Secondary | ICD-10-CM | POA: Insufficient documentation

## 2016-01-22 DIAGNOSIS — W1839XA Other fall on same level, initial encounter: Secondary | ICD-10-CM | POA: Diagnosis not present

## 2016-01-22 DIAGNOSIS — F0390 Unspecified dementia without behavioral disturbance: Secondary | ICD-10-CM | POA: Insufficient documentation

## 2016-01-22 DIAGNOSIS — M1990 Unspecified osteoarthritis, unspecified site: Secondary | ICD-10-CM | POA: Diagnosis not present

## 2016-01-22 DIAGNOSIS — W19XXXA Unspecified fall, initial encounter: Secondary | ICD-10-CM

## 2016-01-22 DIAGNOSIS — S79912A Unspecified injury of left hip, initial encounter: Secondary | ICD-10-CM | POA: Diagnosis not present

## 2016-01-22 DIAGNOSIS — Z79899 Other long term (current) drug therapy: Secondary | ICD-10-CM | POA: Insufficient documentation

## 2016-01-22 LAB — URINALYSIS, ROUTINE W REFLEX MICROSCOPIC
BILIRUBIN URINE: NEGATIVE
Glucose, UA: NEGATIVE mg/dL
Ketones, ur: NEGATIVE mg/dL
Nitrite: POSITIVE — AB
PH: 5.5 (ref 5.0–8.0)
Protein, ur: NEGATIVE mg/dL
SPECIFIC GRAVITY, URINE: 1.021 (ref 1.005–1.030)

## 2016-01-22 LAB — URINE MICROSCOPIC-ADD ON

## 2016-01-22 MED ORDER — CEPHALEXIN 500 MG PO CAPS
500.0000 mg | ORAL_CAPSULE | Freq: Once | ORAL | Status: AC
  Administered 2016-01-22: 500 mg via ORAL
  Filled 2016-01-22: qty 1

## 2016-01-22 MED ORDER — TRAMADOL HCL 50 MG PO TABS
50.0000 mg | ORAL_TABLET | Freq: Once | ORAL | Status: AC
  Administered 2016-01-22: 50 mg via ORAL
  Filled 2016-01-22: qty 1

## 2016-01-22 MED ORDER — CEPHALEXIN 500 MG PO CAPS: 500.0000 mg | ORAL_CAPSULE | Freq: Two times a day (BID) | ORAL | Status: DC

## 2016-01-22 NOTE — ED Provider Notes (Signed)
CSN: [id number]     Arrival date & time 01/22/16  1143 History   First MD Initiated Contact with Patient 01/22/16 1402     Chief Complaint  Patient presents with  . Leg Pain     (Consider location/radiation/quality/duration/timing/severity/associated sxs/prior Treatment) HPI Comments: Pt comes in with cc of pelvic pain. Pt had a fall recently. She is unable to walk. She reports pain in her hips and by her knees. Xrays of the hip are neg. Pt denies any back pain. Lives at an assisted living.  The history is provided by the patient.    Past Medical History  Diagnosis Date  . Diabetes mellitus type 2 with neurological manifestations (HCC)   . Hypertension   . Dementia   . Arthritis   . Hypothyroid   . TIA (transient ischemic attack)   . Osteoarthritis   . Hypokalemia 11/2013  . Pneumonia and influenza 11/2013   Past Surgical History  Procedure Laterality Date  . Tonsillectomy  1938  . Left knee surgery  2003  . Abdominal hysterectomy     Family History  Problem Relation Age of Onset  . Arthritis Mother   . Stroke Mother   . Hypertension Mother   . Colon cancer Father   . Lung cancer Brother   . Kidney cancer Brother   . Diabetes Maternal Aunt    Social History  Substance Use Topics  . Smoking status: Never Smoker   . Smokeless tobacco: Never Used  . Alcohol Use: No   OB History    No data available     Review of Systems  Constitutional: Positive for activity change.  Respiratory: Negative for shortness of breath.   Cardiovascular: Negative for chest pain.  Gastrointestinal: Negative for nausea, vomiting and abdominal pain.  Genitourinary: Negative for dysuria.  Musculoskeletal: Positive for arthralgias and gait problem. Negative for neck pain.  Neurological: Negative for headaches.      Allergies  Review of patient's allergies indicates no known allergies.  Home Medications   Prior to Admission medications   Medication Sig Start Date End Date Taking?  Authorizing Provider  aspirin 81 MG tablet Take 81 mg by mouth daily.   Yes Historical Provider, MD  clopidogrel (PLAVIX) 75 MG tablet Take 1 tablet (75 mg total) by mouth daily. 05/26/15  Yes Myrlene BrokerElizabeth A Crawford, MD  levothyroxine (SYNTHROID, LEVOTHROID) 75 MCG tablet Take 1 tablet (75 mcg total) by mouth daily. 05/26/15  Yes Myrlene BrokerElizabeth A Crawford, MD  lisinopril-hydrochlorothiazide (PRINZIDE,ZESTORETIC) 10-12.5 MG per tablet Take 1 tablet by mouth daily. 05/26/15  Yes Newt LukesValerie A Leschber, MD  metFORMIN (GLUCOPHAGE) 500 MG tablet Take 1 tablet (500 mg total) by mouth daily with breakfast. 10/30/15  Yes Myrlene BrokerElizabeth A Crawford, MD  pravastatin (PRAVACHOL) 20 MG tablet Take 1 tablet (20 mg total) by mouth daily. 05/26/15  Yes Myrlene BrokerElizabeth A Crawford, MD  cephALEXin (KEFLEX) 500 MG capsule Take 1 capsule (500 mg total) by mouth 2 (two) times daily. 01/22/16   Tilden FossaElizabeth Rees, MD  nystatin cream (MYCOSTATIN) Apply 1 application topically 2 (two) times daily. Patient not taking: Reported on 01/22/2016 10/28/15   Myrlene BrokerElizabeth A Crawford, MD   BP 132/57 mmHg  Pulse 69  Temp(Src) 98.1 F (36.7 C) (Oral)  Resp 16  SpO2 98% Physical Exam  Constitutional: She is oriented to person, place, and time. She appears well-developed.  HENT:  Head: Normocephalic and atraumatic.  Eyes: EOM are normal.  Neck: Normal range of motion. Neck supple.  Cardiovascular: Normal rate.  Pulmonary/Chest: Effort normal.  Abdominal: Bowel sounds are normal.  Musculoskeletal:  Pt has bilateral hip tenderness. Unable to ambulate with me assiting her - just takes few slow steps.   Neurological: She is alert and oriented to person, place, and time.  Skin: Skin is warm and dry.  Nursing note and vitals reviewed.   ED Course  Procedures (including critical care time) Labs Review Labs Reviewed  URINALYSIS, ROUTINE W REFLEX MICROSCOPIC (NOT AT [facility]) - Abnormal; Notable for the following:    APPearance CLOUDY (*)    Hgb urine dipstick  SMALL (*)    Nitrite POSITIVE (*)    Leukocytes, UA MODERATE (*)    All other components within normal limits  URINE MICROSCOPIC-ADD ON - Abnormal; Notable for the following:    Squamous Epithelial / LPF 0-5 (*)    Bacteria, UA MANY (*)    All other components within normal limits    Imaging Review Ct Pelvis Wo Contrast  01/22/2016  CLINICAL DATA:  Fall today, bilateral hip pain right greater than left, unable to ambulate. EXAM: CT PELVIS WITHOUT CONTRAST TECHNIQUE: Multidetector CT imaging of the pelvis was performed following the standard protocol without intravenous contrast. COMPARISON:  None. FINDINGS: Osseous structures of the pelvis and hips appear intact and normally aligned throughout. No fracture line or displaced fracture fragment seen. No acute or suspicious osseous lesions seen. Degenerative changes noted within the lower lumbar spine, incompletely imaged, at least moderate in degree. Mild degenerative spurring noted at each hip joint. Scattered diverticulosis noted within the sigmoid and lower descending colon but no inflammatory change to suggest acute diverticulitis. Intrapelvic soft tissues are otherwise unremarkable. Superficial soft tissues about the pelvis and bilateral hips are unremarkable. No soft tissue hematoma. IMPRESSION: No acute findings.  No osseous fracture or dislocation. Degenerative changes, as detailed above. Sigmoid colon diverticulosis without evidence of acute diverticulitis. Electronically Signed   By: Bary Richard M.D.   On: 01/22/2016 16:54   Dg Femur Min 2 Views Left  01/22/2016  CLINICAL DATA:  Pt fell today. Bilateral femur pain. Pain with weight bearing. Pt had hip xrays earlier so distal femur images done only. EXAM: LEFT FEMUR 2 VIEWS COMPARISON:  None. FINDINGS: The femoral head is obscured due to exposure, ball was image earlier today on the hip radiographs. Femoral shaft normal.  Severe osteoarthritis of the left knee. IMPRESSION: 1. No fracture of the  femoral shaft. 2. Severe osteoarthritis of the left knee . Electronically Signed   By: Gaylyn Rong M.D.   On: 01/22/2016 16:32   Dg Femur, Min 2 Views Right  01/22/2016  CLINICAL DATA:  Status post fall today. Bilateral upper leg pain. Initial encounter. EXAM: RIGHT FEMUR 2 VIEWS COMPARISON:  None. FINDINGS: No acute bony or joint abnormality is identified. Moderately severe tricompartmental osteoarthritis about the right knee is noted. There is a small right knee joint effusion. IMPRESSION: No acute abnormality. Right knee osteoarthritis with an associated small effusion. Electronically Signed   By: Drusilla Kanner M.D.   On: 01/22/2016 16:32   Dg Hips Bilat With Pelvis Min 5 Views  01/22/2016  CLINICAL DATA:  Slid out of bed early this morning, general pelvic and BILATERAL hip discomfort post fall, hypertension, type II diabetes mellitus EXAM: DG HIP (WITH OR WITHOUT PELVIS) 5+V BILAT COMPARISON:  None FINDINGS: Osseous demineralization. Hip and SI joint spaces symmetric and preserved. No acute fracture, dislocation or bone destruction. Mild degenerative changes at visualized lower lumbar spine. IMPRESSION: No acute  osseous abnormalities. Electronically Signed   By: Ulyses Southward M.D.   On: 01/22/2016 15:16   I have personally reviewed and evaluated these images and lab results as part of my medical decision-making.   EKG Interpretation None      MDM   Final diagnoses:  UTI (lower urinary tract infection)  Fall, initial encounter    Fall - unable to walk. Will get CT pelvis. Dr. Pecola Leisure to f/u. If CT is neg, pt is able to walk a bit more and family comfortable, she can go home. UA is + - will treat as uti.     Derwood Kaplan, MD 01/24/16 [phone]

## 2016-01-22 NOTE — ED Notes (Signed)
Patient transported to X-ray 

## 2016-01-22 NOTE — ED Notes (Signed)
Patient transported to CT 

## 2016-01-22 NOTE — Discharge Instructions (Signed)
Musculoskeletal Pain °Musculoskeletal pain is muscle and boney aches and pains. These pains can occur in any part of the body. Your caregiver may treat you without knowing the cause of the pain. They may treat you if blood or urine tests, X-rays, and other tests were normal.  °CAUSES °There is often not a definite cause or reason for these pains. These pains may be caused by a type of germ (virus). The discomfort may also come from overuse. Overuse includes working out too hard when your body is not fit. Boney aches also come from weather changes. Bone is sensitive to atmospheric pressure changes. °HOME CARE INSTRUCTIONS  °· Ask when your test results will be ready. Make sure you get your test results. °· Only take over-the-counter or prescription medicines for pain, discomfort, or fever as directed by your caregiver. If you were given medications for your condition, do not drive, operate machinery or power tools, or sign legal documents for 24 hours. Do not drink alcohol. Do not take sleeping pills or other medications that may interfere with treatment. °· Continue all activities unless the activities cause more pain. When the pain lessens, slowly resume normal activities. Gradually increase the intensity and duration of the activities or exercise. °· During periods of severe pain, bed rest may be helpful. Lay or sit in any position that is comfortable. °· Putting ice on the injured area. °¨ Put ice in a bag. °¨ Place a towel between your skin and the bag. °¨ Leave the ice on for 15 to 20 minutes, 3 to 4 times a day. °· Follow up with your caregiver for continued problems and no reason can be found for the pain. If the pain becomes worse or does not go away, it may be necessary to repeat tests or do additional testing. Your caregiver may need to look further for a possible cause. °SEEK IMMEDIATE MEDICAL CARE IF: °· You have pain that is getting worse and is not relieved by medications. °· You develop chest pain  that is associated with shortness or breath, sweating, feeling sick to your stomach (nauseous), or throw up (vomit). °· Your pain becomes localized to the abdomen. °· You develop any new symptoms that seem different or that concern you. °MAKE SURE YOU:  °· Understand these instructions. °· Will watch your condition. °· Will get help right away if you are not doing well or get worse. °  °This information is not intended to replace advice given to you by your health care provider. Make sure you discuss any questions you have with your health care provider. °  °Document Released: 11/01/2005 Document Revised: 01/24/2012 Document Reviewed: 07/06/2013 °Elsevier Interactive Patient Education ©2016 Elsevier Inc. °Urinary Tract Infection °Urinary tract infections (UTIs) can develop anywhere along your urinary tract. Your urinary tract is your body's drainage system for removing wastes and extra water. Your urinary tract includes two kidneys, two ureters, a bladder, and a urethra. Your kidneys are a pair of bean-shaped organs. Each kidney is about the size of your fist. They are located below your ribs, one on each side of your spine. °CAUSES °Infections are caused by microbes, which are microscopic organisms, including fungi, viruses, and bacteria. These organisms are so small that they can only be seen through a microscope. Bacteria are the microbes that most commonly cause UTIs. °SYMPTOMS  °Symptoms of UTIs may vary by age and gender of the patient and by the location of the infection. Symptoms in young women typically include a frequent   and intense urge to urinate and a painful, burning feeling in the bladder or urethra during urination. Older women and men are more likely to be tired, shaky, and weak and have muscle aches and abdominal pain. A fever may mean the infection is in your kidneys. Other symptoms of a kidney infection include pain in your back or sides below the ribs, nausea, and vomiting. °DIAGNOSIS °To  diagnose a UTI, your caregiver will ask you about your symptoms. Your caregiver will also ask you to provide a urine sample. The urine sample will be tested for bacteria and white blood cells. White blood cells are made by your body to help fight infection. °TREATMENT  °Typically, UTIs can be treated with medication. Because most UTIs are caused by a bacterial infection, they usually can be treated with the use of antibiotics. The choice of antibiotic and length of treatment depend on your symptoms and the type of bacteria causing your infection. °HOME CARE INSTRUCTIONS °· If you were prescribed antibiotics, take them exactly as your caregiver instructs you. Finish the medication even if you feel better after you have only taken some of the medication. °· Drink enough water and fluids to keep your urine clear or pale yellow. °· Avoid caffeine, tea, and carbonated beverages. They tend to irritate your bladder. °· Empty your bladder often. Avoid holding urine for long periods of time. °· Empty your bladder before and after sexual intercourse. °· After a bowel movement, women should cleanse from front to back. Use each tissue only once. °SEEK MEDICAL CARE IF:  °· You have back pain. °· You develop a fever. °· Your symptoms do not begin to resolve within 3 days. °SEEK IMMEDIATE MEDICAL CARE IF:  °· You have severe back pain or lower abdominal pain. °· You develop chills. °· You have nausea or vomiting. °· You have continued burning or discomfort with urination. °MAKE SURE YOU:  °· Understand these instructions. °· Will watch your condition. °· Will get help right away if you are not doing well or get worse. °  °This information is not intended to replace advice given to you by your health care provider. Make sure you discuss any questions you have with your health care provider. °  °Document Released: 08/11/2005 Document Revised: 07/23/2015 Document Reviewed: 12/10/2011 °Elsevier Interactive Patient Education ©2016  Elsevier Inc. ° °

## 2016-01-22 NOTE — ED Notes (Signed)
Per EMS, patient is from [facility].  Patient fell in her room and hurt left leg.  Unwitnessed fall.  Patient did not lose LOC.  Staff stated patient falls often.    BP:142/70 P:84 R:16 O2:97% room air  CBG:160

## 2016-01-22 NOTE — ED Provider Notes (Signed)
Pt visit shared. Pt here with pelvic pain, no evidence of acute fracture on imaging.  Pt able to ambulate with assistance, UA c/w UTI.  Discussed with patient and family findings of studies.  They wish to return to ALF with outpatient follow up.  Home care and return precautions discussed.    Tilden FossaElizabeth Cleona Doubleday, MD 01/23/16 1108

## 2016-04-27 ENCOUNTER — Ambulatory Visit: Payer: Medicare Other | Admitting: Internal Medicine

## 2016-04-27 ENCOUNTER — Telehealth: Payer: Self-pay | Admitting: Internal Medicine

## 2016-04-27 NOTE — Telephone Encounter (Signed)
Son called stated Carol Santiago was suppose to come in today but [facility] did not bring her as he arrange for this appt. He is hopping that we will not be charge for this time, he is unable to be with her due to his job ([place] [employer] Please advise

## 2016-04-27 NOTE — Telephone Encounter (Signed)
I cancelled this appt and noted not to charge the No Show fee. Please inform pt son.

## 2016-04-28 NOTE — Telephone Encounter (Signed)
Left vm for pt's son to call back to inform of massage below.

## 2016-05-07 ENCOUNTER — Ambulatory Visit: Payer: Medicare Other | Admitting: Internal Medicine

## 2016-05-07 ENCOUNTER — Ambulatory Visit (INDEPENDENT_AMBULATORY_CARE_PROVIDER_SITE_OTHER): Payer: Medicare Other | Admitting: Internal Medicine

## 2016-05-07 ENCOUNTER — Encounter: Payer: Self-pay | Admitting: Internal Medicine

## 2016-05-07 ENCOUNTER — Other Ambulatory Visit (INDEPENDENT_AMBULATORY_CARE_PROVIDER_SITE_OTHER): Payer: Medicare Other

## 2016-05-07 VITALS — BP 128/78 | HR 66 | Temp 98.5°F | Resp 14 | Ht 66.0 in | Wt 167.0 lb

## 2016-05-07 DIAGNOSIS — H6123 Impacted cerumen, bilateral: Secondary | ICD-10-CM | POA: Diagnosis not present

## 2016-05-07 DIAGNOSIS — H918X3 Other specified hearing loss, bilateral: Secondary | ICD-10-CM | POA: Diagnosis not present

## 2016-05-07 DIAGNOSIS — E1149 Type 2 diabetes mellitus with other diabetic neurological complication: Secondary | ICD-10-CM | POA: Diagnosis not present

## 2016-05-07 DIAGNOSIS — IMO0001 Reserved for inherently not codable concepts without codable children: Secondary | ICD-10-CM

## 2016-05-07 DIAGNOSIS — E1165 Type 2 diabetes mellitus with hyperglycemia: Secondary | ICD-10-CM

## 2016-05-07 DIAGNOSIS — H6120 Impacted cerumen, unspecified ear: Secondary | ICD-10-CM | POA: Insufficient documentation

## 2016-05-07 LAB — HEMOGLOBIN A1C: Hgb A1c MFr Bld: 7.5 % — ABNORMAL HIGH (ref 4.6–6.5)

## 2016-05-07 NOTE — Progress Notes (Signed)
   Subjective:    Patient ID: Carol Santiago, female    DOB: 10-11-1925, 82 y.o.   MRN: [id number]  HPI The patient is an 82 YO female coming in for follow up of her diabetes (she is taking metformin newly since last visit due to elevated HgA1c, not complicated). She is also having some hearing loss acute that she worries is due to wax in her ears. Has had to have it removed in the past. No other new concerns.   Review of Systems  Constitutional: Negative for fever, activity change, appetite change and fatigue.  HENT: Positive for hearing loss.   Respiratory: Negative for cough, chest tightness, shortness of breath and wheezing.   Cardiovascular: Negative for chest pain, palpitations and leg swelling.  Gastrointestinal: Negative for abdominal pain, diarrhea, constipation, blood in stool and abdominal distention.  Musculoskeletal: Positive for gait problem. Negative for myalgias, back pain and arthralgias.  Neurological: Positive for weakness. Negative for dizziness, syncope and numbness.       Memory change  Psychiatric/Behavioral: Negative.       Objective:   Physical Exam  Constitutional: She is oriented to person, place, and time. She appears well-developed and well-nourished.  HENT:  Head: Normocephalic and atraumatic.  Left and right ear obstructed by wax, after lavage TM normal bilaterally.   Eyes: EOM are normal.  Neck: Normal range of motion.  Cardiovascular: Normal rate and regular rhythm.   Pulmonary/Chest: Effort normal and breath sounds normal. No respiratory distress. She has no wheezes. She has no rales.  Abdominal: Soft. She exhibits no distension. There is no tenderness. There is no rebound.  Musculoskeletal: She exhibits no edema.  Neurological: She is alert and oriented to person, place, and time. Coordination normal.  Skin: Skin is warm and dry.  Psychiatric: She has a normal mood and affect.   Filed Vitals:   05/07/16 1022  BP: 128/78  Pulse: 66  Temp: 98.5  F (36.9 C)  TempSrc: Oral  Resp: 14  Height: 5\' 6"  (1.676 m)  Weight: 167 lb (75.751 kg)  SpO2: 97%      Assessment & Plan:

## 2016-05-07 NOTE — Assessment & Plan Note (Signed)
Bilateral lavage done and wax removed. Hearing improved with intervention in the office today.

## 2016-05-07 NOTE — Patient Instructions (Signed)
We are checking the labs today and have cleaned out your ears today.   Diabetes Mellitus and Food It is important for you to manage your blood sugar (glucose) level. Your blood glucose level can be greatly affected by what you eat. Eating healthier foods in the appropriate amounts throughout the day at about the same time each day will help you control your blood glucose level. It can also help slow or prevent worsening of your diabetes mellitus. Healthy eating may even help you improve the level of your blood pressure and reach or maintain a healthy weight.  General recommendations for healthful eating and cooking habits include:  Eating meals and snacks regularly. Avoid going long periods of time without eating to lose weight.  Eating a diet that consists mainly of plant-based foods, such as fruits, vegetables, nuts, legumes, and whole grains.  Using low-heat cooking methods, such as baking, instead of high-heat cooking methods, such as deep frying. Work with your dietitian to make sure you understand how to use the Nutrition Facts information on food labels. HOW CAN FOOD AFFECT ME? Carbohydrates Carbohydrates affect your blood glucose level more than any other type of food. Your dietitian will help you determine how many carbohydrates to eat at each meal and teach you how to count carbohydrates. Counting carbohydrates is important to keep your blood glucose at a healthy level, especially if you are using insulin or taking certain medicines for diabetes mellitus. Alcohol Alcohol can cause sudden decreases in blood glucose (hypoglycemia), especially if you use insulin or take certain medicines for diabetes mellitus. Hypoglycemia can be a life-threatening condition. Symptoms of hypoglycemia (sleepiness, dizziness, and disorientation) are similar to symptoms of having too much alcohol.  If your health care provider has given you approval to drink alcohol, do so in moderation and use the following  guidelines:  Women should not have more than one drink per day, and men should not have more than two drinks per day. One drink is equal to:  12 oz of beer.  5 oz of wine.  1 oz of hard liquor.  Do not drink on an empty stomach.  Keep yourself hydrated. Have water, diet soda, or unsweetened iced tea.  Regular soda, juice, and other mixers might contain a lot of carbohydrates and should be counted. WHAT FOODS ARE NOT RECOMMENDED? As you make food choices, it is important to remember that all foods are not the same. Some foods have fewer nutrients per serving than other foods, even though they might have the same number of calories or carbohydrates. It is difficult to get your body what it needs when you eat foods with fewer nutrients. Examples of foods that you should avoid that are high in calories and carbohydrates but low in nutrients include:  Trans fats (most processed foods list trans fats on the Nutrition Facts label).  Regular soda.  Juice.  Candy.  Sweets, such as cake, pie, doughnuts, and cookies.  Fried foods. WHAT FOODS CAN I EAT? Eat nutrient-rich foods, which will nourish your body and keep you healthy. The food you should eat also will depend on several factors, including:  The calories you need.  The medicines you take.  Your weight.  Your blood glucose level.  Your blood pressure level.  Your cholesterol level. You should eat a variety of foods, including:  Protein.  Lean cuts of meat.  Proteins low in saturated fats, such as fish, egg whites, and beans. Avoid processed meats.  Fruits and vegetables.  Fruits and vegetables that may help control blood glucose levels, such as apples, mangoes, and yams.  Dairy products.  Choose fat-free or low-fat dairy products, such as milk, yogurt, and cheese.  Grains, bread, pasta, and rice.  Choose whole grain products, such as multigrain bread, whole oats, and brown rice. These foods may help control  blood pressure.  Fats.  Foods containing healthful fats, such as nuts, avocado, olive oil, canola oil, and fish. DOES EVERYONE WITH DIABETES MELLITUS HAVE THE SAME MEAL PLAN? Because every person with diabetes mellitus is different, there is not one meal plan that works for everyone. It is very important that you meet with a dietitian who will help you create a meal plan that is just right for you.   This information is not intended to replace advice given to you by your health care provider. Make sure you discuss any questions you have with your health care provider.   Document Released: 07/29/2005 Document Revised: 11/22/2014 Document Reviewed: 09/28/2013 Elsevier Interactive Patient Education [employer].

## 2016-05-07 NOTE — Assessment & Plan Note (Signed)
Not well controlled as of last HgA1c, started back on metformin 500 mg daily. Checking HgA1c and adjust as needed. Complicated and stable. Taking ACE-I and statin.

## 2016-05-07 NOTE — Progress Notes (Signed)
Pre visit review using our clinic review tool, if applicable. No additional management support is needed unless otherwise documented below in the visit note. 

## 2016-06-14 ENCOUNTER — Other Ambulatory Visit: Payer: Self-pay | Admitting: Internal Medicine

## 2016-06-30 ENCOUNTER — Other Ambulatory Visit: Payer: Self-pay | Admitting: Internal Medicine

## 2016-08-06 ENCOUNTER — Encounter: Payer: Self-pay | Admitting: Internal Medicine

## 2016-08-06 ENCOUNTER — Ambulatory Visit (INDEPENDENT_AMBULATORY_CARE_PROVIDER_SITE_OTHER): Payer: Medicare Other | Admitting: Internal Medicine

## 2016-08-06 ENCOUNTER — Other Ambulatory Visit (INDEPENDENT_AMBULATORY_CARE_PROVIDER_SITE_OTHER): Payer: Medicare Other

## 2016-08-06 VITALS — BP 116/72 | HR 75 | Temp 97.9°F | Resp 14 | Ht 66.0 in | Wt 168.0 lb

## 2016-08-06 DIAGNOSIS — F0390 Unspecified dementia without behavioral disturbance: Secondary | ICD-10-CM | POA: Diagnosis not present

## 2016-08-06 DIAGNOSIS — E1149 Type 2 diabetes mellitus with other diabetic neurological complication: Secondary | ICD-10-CM | POA: Diagnosis not present

## 2016-08-06 DIAGNOSIS — Z23 Encounter for immunization: Secondary | ICD-10-CM | POA: Diagnosis not present

## 2016-08-06 LAB — COMPREHENSIVE METABOLIC PANEL
ALBUMIN: 3.6 g/dL (ref 3.5–5.2)
ALT: 11 U/L (ref 0–35)
AST: 14 U/L (ref 0–37)
Alkaline Phosphatase: 52 U/L (ref 39–117)
BUN: 28 mg/dL — AB (ref 6–23)
CHLORIDE: 104 meq/L (ref 96–112)
CO2: 31 mEq/L (ref 19–32)
CREATININE: 0.77 mg/dL (ref 0.40–1.20)
Calcium: 9.2 mg/dL (ref 8.4–10.5)
GFR: 74.95 mL/min (ref >60.00)
GLUCOSE: 195 mg/dL — AB (ref 70–99)
POTASSIUM: 3.9 meq/L (ref 3.5–5.1)
SODIUM: 140 meq/L (ref 135–145)
TOTAL PROTEIN: 6.8 g/dL (ref 6.0–8.3)
Total Bilirubin: 0.9 mg/dL (ref 0.2–1.2)

## 2016-08-06 LAB — TSH: TSH: 2.06 u[IU]/mL (ref 0.35–4.50)

## 2016-08-06 LAB — HEMOGLOBIN A1C: Hgb A1c MFr Bld: 8 % — ABNORMAL HIGH (ref 4.6–6.5)

## 2016-08-06 MED ORDER — DONEPEZIL HCL 10 MG PO TABS: 10.0000 mg | ORAL_TABLET | Freq: Every day | ORAL | 3 refills | Status: AC

## 2016-08-06 NOTE — Patient Instructions (Signed)
We have sent in the memory medicine called aricept (donepizil) that you will take 1 pill a day to help with the memory. Some people who take it notice some benefit to the memory. The main thing that it does is to keep the memory from worsening with time.

## 2016-08-06 NOTE — Assessment & Plan Note (Signed)
Start aricept as she is willing. She did not want to do the MMSE today and has not been willing to bring a family member to provide insight into how severe the memory problems are.

## 2016-08-06 NOTE — Progress Notes (Signed)
   Subjective:    Patient ID: Carol Santiago, female    DOB: September 22, 1925, 82 y.o.   MRN: [id number]  HPI The patient is an 82 YO female coming in for follow up of her sugars. She had improvement in her sugars last visit and now is controlled. She has been doing fine since last time. She does feel that her memory is worsening and she is now open to talking about medication for her memory to help (previously she was resistant). She is forgetting certain things and not others. She is not well able to clarify. She does not want to take the memory test or answer questions about memory. She does not have family member with her today.   Review of Systems  Constitutional: Negative for activity change, appetite change, fatigue and fever.  HENT: Positive for hearing loss.   Respiratory: Negative for cough, chest tightness, shortness of breath and wheezing.   Cardiovascular: Negative for chest pain, palpitations and leg swelling.  Gastrointestinal: Negative for abdominal distention, abdominal pain, blood in stool, constipation and diarrhea.  Musculoskeletal: Positive for gait problem. Negative for arthralgias, back pain and myalgias.  Neurological: Negative for dizziness, syncope, weakness and numbness.       Memory change  Psychiatric/Behavioral: Negative.       Objective:   Physical Exam  Constitutional: She appears well-developed and well-nourished.  HENT:  Head: Normocephalic and atraumatic.  Eyes: EOM are normal.  Neck: Normal range of motion.  Cardiovascular: Normal rate and regular rhythm.   Pulmonary/Chest: Effort normal and breath sounds normal. No respiratory distress. She has no wheezes. She has no rales.  Abdominal: Soft. Bowel sounds are normal. She exhibits no distension. There is no tenderness. There is no rebound.  Musculoskeletal: She exhibits no edema.  Neurological: She is alert. Coordination normal.  Skin: Skin is warm and dry.   Vitals:   08/06/16 1007  BP: 116/72    Pulse: 75  Resp: 14  Temp: 97.9 F (36.6 C)  TempSrc: Oral  SpO2: 97%  Weight: 168 lb (76.2 kg)  Height: 5\' 6"  (1.676 m)      Assessment & Plan:  High dose flu shot given at visit.

## 2016-08-06 NOTE — Assessment & Plan Note (Signed)
Checking HgA1c, controlled as of last. Complicated by neuropathy which she is controlling with diet changes. Foot exam done. Given flu shot at visit. Adjust her metformin 500 mg daily as needed. Goal <8.

## 2016-08-06 NOTE — Progress Notes (Signed)
Pre visit review using our clinic review tool, if applicable. No additional management support is needed unless otherwise documented below in the visit note. 

## 2016-08-10 ENCOUNTER — Encounter (HOSPITAL_COMMUNITY): Payer: Self-pay | Admitting: Emergency Medicine

## 2016-08-10 ENCOUNTER — Inpatient Hospital Stay (HOSPITAL_COMMUNITY)
Admission: EM | Admit: 2016-08-10 | Discharge: 2016-08-13 | DRG: 378 | Disposition: A | Discharge status: Home Health | Payer: Medicare Other | Attending: Nephrology | Admitting: Nephrology

## 2016-08-10 ENCOUNTER — Emergency Department (HOSPITAL_COMMUNITY): Payer: Medicare Other

## 2016-08-10 DIAGNOSIS — Z7982 Long term (current) use of aspirin: Secondary | ICD-10-CM

## 2016-08-10 DIAGNOSIS — Z8249 Family history of ischemic heart disease and other diseases of the circulatory system: Secondary | ICD-10-CM

## 2016-08-10 DIAGNOSIS — Z79899 Other long term (current) drug therapy: Secondary | ICD-10-CM

## 2016-08-10 DIAGNOSIS — R262 Difficulty in walking, not elsewhere classified: Secondary | ICD-10-CM

## 2016-08-10 DIAGNOSIS — E1149 Type 2 diabetes mellitus with other diabetic neurological complication: Secondary | ICD-10-CM | POA: Diagnosis present

## 2016-08-10 DIAGNOSIS — I10 Essential (primary) hypertension: Secondary | ICD-10-CM | POA: Diagnosis present

## 2016-08-10 DIAGNOSIS — E039 Hypothyroidism, unspecified: Secondary | ICD-10-CM | POA: Diagnosis present

## 2016-08-10 DIAGNOSIS — K254 Chronic or unspecified gastric ulcer with hemorrhage: Principal | ICD-10-CM | POA: Diagnosis present

## 2016-08-10 DIAGNOSIS — Z66 Do not resuscitate: Secondary | ICD-10-CM | POA: Diagnosis present

## 2016-08-10 DIAGNOSIS — I119 Hypertensive heart disease without heart failure: Secondary | ICD-10-CM | POA: Diagnosis present

## 2016-08-10 DIAGNOSIS — Z8673 Personal history of transient ischemic attack (TIA), and cerebral infarction without residual deficits: Secondary | ICD-10-CM

## 2016-08-10 DIAGNOSIS — K449 Diaphragmatic hernia without obstruction or gangrene: Secondary | ICD-10-CM | POA: Diagnosis present

## 2016-08-10 DIAGNOSIS — Z823 Family history of stroke: Secondary | ICD-10-CM

## 2016-08-10 DIAGNOSIS — Z7984 Long term (current) use of oral hypoglycemic drugs: Secondary | ICD-10-CM

## 2016-08-10 DIAGNOSIS — K222 Esophageal obstruction: Secondary | ICD-10-CM | POA: Diagnosis present

## 2016-08-10 DIAGNOSIS — K920 Hematemesis: Secondary | ICD-10-CM | POA: Diagnosis not present

## 2016-08-10 DIAGNOSIS — Z833 Family history of diabetes mellitus: Secondary | ICD-10-CM

## 2016-08-10 DIAGNOSIS — K210 Gastro-esophageal reflux disease with esophagitis: Secondary | ICD-10-CM | POA: Diagnosis present

## 2016-08-10 DIAGNOSIS — E785 Hyperlipidemia, unspecified: Secondary | ICD-10-CM | POA: Diagnosis present

## 2016-08-10 DIAGNOSIS — K2970 Gastritis, unspecified, without bleeding: Secondary | ICD-10-CM | POA: Diagnosis present

## 2016-08-10 DIAGNOSIS — F0390 Unspecified dementia without behavioral disturbance: Secondary | ICD-10-CM | POA: Diagnosis present

## 2016-08-10 DIAGNOSIS — Z7902 Long term (current) use of antithrombotics/antiplatelets: Secondary | ICD-10-CM

## 2016-08-10 DIAGNOSIS — D62 Acute posthemorrhagic anemia: Secondary | ICD-10-CM | POA: Diagnosis present

## 2016-08-10 DIAGNOSIS — Z8711 Personal history of peptic ulcer disease: Secondary | ICD-10-CM

## 2016-08-10 DIAGNOSIS — K922 Gastrointestinal hemorrhage, unspecified: Secondary | ICD-10-CM | POA: Diagnosis present

## 2016-08-10 DIAGNOSIS — K760 Fatty (change of) liver, not elsewhere classified: Secondary | ICD-10-CM | POA: Diagnosis present

## 2016-08-10 LAB — COMPREHENSIVE METABOLIC PANEL
ALK PHOS: 55 U/L (ref 38–126)
ALT: 15 U/L (ref 14–54)
ANION GAP: 7 (ref 5–15)
AST: 18 U/L (ref 15–41)
Albumin: 4.1 g/dL (ref 3.5–5.0)
BILIRUBIN TOTAL: 1.5 mg/dL — AB (ref 0.3–1.2)
BUN: 33 mg/dL — ABNORMAL HIGH (ref 6–20)
CALCIUM: 9.2 mg/dL (ref 8.9–10.3)
CO2: 30 mmol/L (ref 22–32)
Chloride: 103 mmol/L (ref 101–111)
Creatinine, Ser: 0.8 mg/dL (ref 0.44–1.00)
GLUCOSE: 230 mg/dL — AB (ref 65–99)
Potassium: 3.9 mmol/L (ref 3.5–5.1)
Sodium: 140 mmol/L (ref 135–145)
TOTAL PROTEIN: 7.3 g/dL (ref 6.5–8.1)

## 2016-08-10 LAB — CBC
HCT: 35.8 % — ABNORMAL LOW (ref 36.0–46.0)
HEMATOCRIT: 34 % — AB (ref 36.0–46.0)
HEMOGLOBIN: 12.2 g/dL (ref 12.0–15.0)
HEMOGLOBIN: 11.6 g/dL — AB (ref 12.0–15.0)
MCH: 32.9 pg (ref 26.0–34.0)
MCH: 33.9 pg (ref 26.0–34.0)
MCHC: 34.1 g/dL (ref 30.0–36.0)
MCHC: 34.1 g/dL (ref 30.0–36.0)
MCV: 96.5 fL (ref 78.0–100.0)
MCV: 99.4 fL (ref 78.0–100.0)
Platelets: 208 10*3/uL (ref 150–400)
Platelets: 294 10*3/uL (ref 150–400)
RBC: 3.71 MIL/uL — AB (ref 3.87–5.11)
RBC: 3.42 MIL/uL — ABNORMAL LOW (ref 3.87–5.11)
RDW: 13.6 % (ref 11.5–15.5)
RDW: 13.9 % (ref 11.5–15.5)
WBC: 6.4 10*3/uL (ref 4.0–10.5)
WBC: 5.8 10*3/uL (ref 4.0–10.5)

## 2016-08-10 LAB — PROTIME-INR
INR: 1.03
PROTHROMBIN TIME: 13.5 s (ref 11.4–15.2)

## 2016-08-10 LAB — TYPE AND SCREEN
ABO/RH(D): A NEG
Antibody Screen: NEGATIVE

## 2016-08-10 LAB — ABO/RH: ABO/RH(D): A NEG

## 2016-08-10 LAB — LIPASE, BLOOD: Lipase: 61 U/L — ABNORMAL HIGH (ref 11–51)

## 2016-08-10 LAB — I-STAT TROPONIN, ED: TROPONIN I, POC: 0 ng/mL (ref 0.00–0.08)

## 2016-08-10 LAB — I-STAT CG4 LACTIC ACID, ED: LACTIC ACID, VENOUS: 1.34 mmol/L (ref 0.5–1.9)

## 2016-08-10 MED ORDER — FENTANYL CITRATE (PF) 100 MCG/2ML IJ SOLN
25.0000 ug | Freq: Once | INTRAMUSCULAR | Status: AC
  Administered 2016-08-10: 25 ug via INTRAVENOUS
  Filled 2016-08-10: qty 2

## 2016-08-10 MED ORDER — PANTOPRAZOLE SODIUM 40 MG IV SOLR
80.0000 mg | Freq: Once | INTRAVENOUS | Status: AC
  Administered 2016-08-10: 80 mg via INTRAVENOUS
  Filled 2016-08-10: qty 80

## 2016-08-10 MED ORDER — SODIUM CHLORIDE 0.9 % IV BOLUS (SEPSIS)
1000.0000 mL | Freq: Once | INTRAVENOUS | Status: AC
  Administered 2016-08-10: 1000 mL via INTRAVENOUS

## 2016-08-10 MED ORDER — ONDANSETRON HCL 4 MG/2ML IJ SOLN
4.0000 mg | Freq: Once | INTRAMUSCULAR | Status: AC
  Administered 2016-08-10: 4 mg via INTRAVENOUS
  Filled 2016-08-10: qty 2

## 2016-08-10 MED ORDER — SODIUM CHLORIDE 0.9 % IV SOLN
8.0000 mg/h | INTRAVENOUS | Status: DC
  Administered 2016-08-11: 8 mg/h via INTRAVENOUS
  Filled 2016-08-10 (×2): qty 80

## 2016-08-10 NOTE — ED Notes (Signed)
Pt had another episode of hematemesis totaling to around 100 cc.

## 2016-08-10 NOTE — ED Notes (Signed)
Pt had another episode of hematemesis totaling around 100 ccs

## 2016-08-10 NOTE — ED Triage Notes (Signed)
Pt comes via EMS from [facility] with complaints of nausea and vomiting that is blood tinged about 2 hours ago.  Vomited 2 times with EMS with blood noted.  Received 4 mg of zofran in route.  Hx of mild dementia and diabetes. BP 151/90, HR 70, CBG 239, O2 98 % RA in route. Ambulatory with a walker normally.

## 2016-08-10 NOTE — ED Provider Notes (Signed)
[facility]-EMERGENCY DEPT Provider Note   CSN: [id number] Arrival date & time: 08/10/16  1941     History   Chief Complaint Chief Complaint  Patient presents with  . Hematemesis    HPI Carol Santiago is a 82 y.o. female With a past medical history significant for diabetes, dementia, hypertension, TIA, Prior peptic ulcer disease, and hypothyroidism currently on Plavix therapy presents for abdominal pain and hematemesis. Patient reports that several hours ago she had onset of bloodied vomit. She says that it was very dark. EMS told nursing that she had approximately 400 mL of the bloody emesis en route. Patient was given Zofran by EMS. Patient reports fatigue but denies syncope, palpitations, chest pain, constipation, diarrhea, dysuria. Patient denies Fevers or chills. Patient Does report a cramping abdominal pain in her epigastrium that is moderate.  The history is provided by the patient, the EMS personnel and medical records. No language interpreter was used.  Emesis   This is a new problem. The current episode started 1 to 2 hours ago. The problem occurs 2 to 4 times per day. The problem has not changed since onset.The emesis has an appearance of stomach contents (dark blood). There has been no fever. Associated symptoms include abdominal pain. Pertinent negatives include no chills, no cough, no diarrhea, no fever, no headaches and no sweats.    Past Medical History:  Diagnosis Date  . Arthritis   . Dementia   . Diabetes mellitus type 2 with neurological manifestations (HCC)   . Hypertension   . Hypokalemia 11/2013  . Hypothyroid   . Osteoarthritis   . Pneumonia and influenza 11/2013  . TIA (transient ischemic attack)     Patient Active Problem List   Diagnosis Date Noted  . Hearing loss due to cerumen impaction 05/07/2016  . Routine general medical examination at a health care facility 05/09/2015  . Dyslipidemia 05/29/2013  . TIA (transient ischemic attack)   . Hypothyroid     . Dementia   . Hypertension   . Diabetes mellitus type 2 with neurological manifestations [facility])     Past Surgical History:  Procedure Laterality Date  . ABDOMINAL HYSTERECTOMY    . Left knee surgery  2003  . TONSILLECTOMY  1938    OB History    No data available       Home Medications    Prior to Admission medications   Medication Sig Start Date End Date Taking? Authorizing Provider  aspirin 81 MG tablet Take 81 mg by mouth daily.    Historical Provider, MD  clopidogrel (PLAVIX) 75 MG tablet Take 1 tablet (75 mg total) by mouth daily. 05/26/15   [name], MD  donepezil (ARICEPT) 10 MG tablet Take 1 tablet (10 mg total) by mouth at bedtime. 08/06/16   [name], MD  levothyroxine (SYNTHROID, LEVOTHROID) 75 MCG tablet TAKE ONE TABLET BY MOUTH ONCE DAILY 07/01/16   [name], MD  lisinopril-hydrochlorothiazide (PRINZIDE,ZESTORETIC) 10-12.5 MG per tablet Take 1 tablet by mouth daily. 05/26/15   Newt Lukes, MD  metFORMIN (GLUCOPHAGE) 500 MG tablet Take 1 tablet (500 mg total) by mouth daily with breakfast. 10/30/15   [name], MD  pravastatin (PRAVACHOL) 20 MG tablet Take 1 tablet (20 mg total) by mouth daily. 05/26/15   [name], MD    Family History Family History  Problem Relation Age of Onset  . Arthritis Mother   . Stroke Mother   . Hypertension Mother   . Colon  cancer Father   . Lung cancer Brother   . Kidney cancer Brother   . Diabetes Maternal Aunt     Social History Social History  Substance Use Topics  . Smoking status: Never Smoker  . Smokeless tobacco: Never Used  . Alcohol use No     Allergies   Review of patient's allergies indicates no known allergies.   Review of Systems Review of Systems  Constitutional: Positive for fatigue. Negative for activity change, chills, diaphoresis and fever.  HENT: Negative for congestion.   Eyes: Negative for visual disturbance.  Respiratory: Negative  for cough, chest tightness, shortness of breath and wheezing.   Cardiovascular: Negative for chest pain and palpitations.  Gastrointestinal: Positive for abdominal pain, nausea and vomiting. Negative for abdominal distention, constipation and diarrhea.  Genitourinary: Negative for dysuria and flank pain.  Musculoskeletal: Negative for back pain, neck pain and neck stiffness.  Skin: Negative for pallor, rash and wound.  Neurological: Negative for syncope, light-headedness and headaches.  Psychiatric/Behavioral: Negative for agitation.  All other systems reviewed and are negative.    Physical Exam Updated Vital Signs BP 144/60 (BP Location: Left Arm)   Pulse 60   Temp 97.6 F (36.4 C) (Oral)   Resp 11   Ht 5\' 6"  (1.676 m)   SpO2 94%   Physical Exam  Constitutional: She is oriented to person, place, and time. She appears well-developed and well-nourished. No distress.  HENT:  Head: Normocephalic and atraumatic.  Mouth/Throat: Oropharynx is clear and moist. No oropharyngeal exudate.  Eyes: Conjunctivae are normal.  Neck: Normal range of motion. Neck supple.  Cardiovascular: Normal rate and regular rhythm.   No murmur heard. Pulmonary/Chest: Effort normal and breath sounds normal. No stridor. No respiratory distress. She has no wheezes. She exhibits no tenderness.  Abdominal: Soft. There is tenderness.  Musculoskeletal: She exhibits no edema or tenderness.  Neurological: She is alert and oriented to person, place, and time. No cranial nerve deficit. She exhibits normal muscle tone.  Skin: Skin is warm and dry.  Psychiatric: She has a normal mood and affect.  Nursing note and vitals reviewed.    ED Treatments / Results  Labs (all labs ordered are listed, but only abnormal results are displayed) Labs Reviewed  LIPASE, BLOOD - Abnormal; Notable for the following:       Result Value   Lipase 61 (*)    All other components within normal limits  COMPREHENSIVE METABOLIC PANEL -  Abnormal; Notable for the following:    Glucose, Bld 230 (*)    BUN 33 (*)    Total Bilirubin 1.5 (*)    All other components within normal limits  CBC - Abnormal; Notable for the following:    RBC 3.71 (*)    HCT 35.8 (*)    All other components within normal limits  URINALYSIS, ROUTINE W REFLEX MICROSCOPIC (NOT AT [facility]) - Abnormal; Notable for the following:    Glucose, UA 250 (*)    All other components within normal limits  CBC - Abnormal; Notable for the following:    RBC 3.42 (*)    Hemoglobin 11.6 (*)    HCT 34.0 (*)    All other components within normal limits  COMPREHENSIVE METABOLIC PANEL - Abnormal; Notable for the following:    Glucose, Bld 180 (*)    BUN 28 (*)    Calcium 8.3 (*)    Total Protein 5.5 (*)    Albumin 3.2 (*)    ALT 12 (*)  All other components within normal limits  CBC - Abnormal; Notable for the following:    RBC 3.47 (*)    Hemoglobin 11.5 (*)    HCT 33.5 (*)    All other components within normal limits  CBC - Abnormal; Notable for the following:    RBC 3.12 (*)    Hemoglobin 10.5 (*)    HCT 31.2 (*)    All other components within normal limits  CBC - Abnormal; Notable for the following:    RBC 3.40 (*)    Hemoglobin 11.3 (*)    HCT 33.8 (*)    All other components within normal limits  GLUCOSE, CAPILLARY - Abnormal; Notable for the following:    Glucose-Capillary 242 (*)    All other components within normal limits  GLUCOSE, CAPILLARY - Abnormal; Notable for the following:    Glucose-Capillary 161 (*)    All other components within normal limits  GLUCOSE, CAPILLARY - Abnormal; Notable for the following:    Glucose-Capillary 124 (*)    All other components within normal limits  MRSA PCR SCREENING  PROTIME-INR  I-STAT CG4 LACTIC ACID, ED  I-STAT TROPOININ, ED  TYPE AND SCREEN  ABO/RH    EKG  EKG Interpretation None       Radiology Dg Chest Portable 1 View  Result Date: 08/10/2016 CLINICAL DATA:  82 year old female with  chest pain and hematemesis. EXAM: PORTABLE CHEST 1 VIEW COMPARISON:  Chest radiograph dated 11/15/2013 FINDINGS: Single portable view of the chest demonstrates mild cardiomegaly with central vascular prominence compatible with mild congestive changes. A small left pleural effusion may be present. Left lung base airspace density may represent atelectatic changes versus infiltrate. Clinical correlation is recommended. There is no pneumothorax. No acute osseous pathology identified. IMPRESSION: Cardiomegaly with mild congestive changes. Probable small left pleural effusion and left lung base atelectasis versus infiltrate. Clinical correlation is recommended. Electronically Signed   By: Elgie Collard M.D.   On: 08/10/2016 22:24    Procedures Procedures (including critical care time)  Medications Ordered in ED Medications - No data to display   Initial Impression / Assessment and Plan / ED Course  I have reviewed the triage vital signs and the nursing notes.  Pertinent labs & imaging results that were available during my care of the patient were reviewed by me and considered in my medical decision making (see chart for details).  Clinical Course    Carol Santiago is a 82 y.o. female With a past medical history significant for diabetes, dementia, hypertension, TIA, Prior peptic ulcer disease, and hypothyroidism currently on Plavix therapy presents for abdominal pain and hematemesis. History and exam are above.  Given patients large amount of bloodied vomit described by EMS, concern for upper G.I. Bleed. With patient's history of peptic ulcer and current Plavix use, patient at risk for continued G.I. Bleed. Patient had laboratory testing which showed normal INR, slightly elevated lipase. Patient's hemoglobin was initially 12.2 which dropped to 11.6 on subsequent evaluation. No evidence of leukocytosis or urinary tract infection. Chest x-ray So small pleural effusion with atelectasis vs infiltrate.  Patient had clear lungs and had no infectious symptoms such as cough, chest pain, or shortness of breath. Lactic acid was nonelevated and type and screen was sent.  Given patient's age, platitudes, and concern for upper G.I. Bleed with Down trending hemoglobin, patient was admitted to hospitalist service for further management. Patient started on protonix drip. Patient admitted in stable condition.    Final Clinical Impressions(s) /  ED Diagnoses   Final diagnoses:  Hematemesis with nausea     Clinical Impression: 1. Hematemesis with nausea     Disposition: Admit to hospitalist service    Heide Scaleshristopher J Malayna Noori, MD 08/11/16 1155

## 2016-08-10 NOTE — ED Notes (Signed)
Bed: [id number] Expected date:  Expected time:  Means of arrival:  Comments: 82 yo F N,V X2HR

## 2016-08-11 ENCOUNTER — Encounter (HOSPITAL_COMMUNITY): Payer: Self-pay | Admitting: *Deleted

## 2016-08-11 DIAGNOSIS — K920 Hematemesis: Secondary | ICD-10-CM | POA: Diagnosis present

## 2016-08-11 DIAGNOSIS — Z7984 Long term (current) use of oral hypoglycemic drugs: Secondary | ICD-10-CM | POA: Diagnosis not present

## 2016-08-11 DIAGNOSIS — I119 Hypertensive heart disease without heart failure: Secondary | ICD-10-CM | POA: Diagnosis present

## 2016-08-11 DIAGNOSIS — E039 Hypothyroidism, unspecified: Secondary | ICD-10-CM | POA: Diagnosis present

## 2016-08-11 DIAGNOSIS — K254 Chronic or unspecified gastric ulcer with hemorrhage: Secondary | ICD-10-CM | POA: Diagnosis present

## 2016-08-11 DIAGNOSIS — R110 Nausea: Secondary | ICD-10-CM

## 2016-08-11 DIAGNOSIS — K760 Fatty (change of) liver, not elsewhere classified: Secondary | ICD-10-CM | POA: Diagnosis present

## 2016-08-11 DIAGNOSIS — K222 Esophageal obstruction: Secondary | ICD-10-CM | POA: Diagnosis present

## 2016-08-11 DIAGNOSIS — Z7982 Long term (current) use of aspirin: Secondary | ICD-10-CM | POA: Diagnosis not present

## 2016-08-11 DIAGNOSIS — K2970 Gastritis, unspecified, without bleeding: Secondary | ICD-10-CM | POA: Diagnosis present

## 2016-08-11 DIAGNOSIS — Z8249 Family history of ischemic heart disease and other diseases of the circulatory system: Secondary | ICD-10-CM | POA: Diagnosis not present

## 2016-08-11 DIAGNOSIS — F0390 Unspecified dementia without behavioral disturbance: Secondary | ICD-10-CM | POA: Diagnosis present

## 2016-08-11 DIAGNOSIS — K449 Diaphragmatic hernia without obstruction or gangrene: Secondary | ICD-10-CM | POA: Diagnosis present

## 2016-08-11 DIAGNOSIS — K210 Gastro-esophageal reflux disease with esophagitis: Secondary | ICD-10-CM | POA: Diagnosis present

## 2016-08-11 DIAGNOSIS — E1149 Type 2 diabetes mellitus with other diabetic neurological complication: Secondary | ICD-10-CM | POA: Diagnosis present

## 2016-08-11 DIAGNOSIS — Z66 Do not resuscitate: Secondary | ICD-10-CM | POA: Diagnosis present

## 2016-08-11 DIAGNOSIS — Z833 Family history of diabetes mellitus: Secondary | ICD-10-CM | POA: Diagnosis not present

## 2016-08-11 DIAGNOSIS — D62 Acute posthemorrhagic anemia: Secondary | ICD-10-CM

## 2016-08-11 DIAGNOSIS — Z823 Family history of stroke: Secondary | ICD-10-CM | POA: Diagnosis not present

## 2016-08-11 DIAGNOSIS — Z7902 Long term (current) use of antithrombotics/antiplatelets: Secondary | ICD-10-CM | POA: Diagnosis not present

## 2016-08-11 DIAGNOSIS — Z8673 Personal history of transient ischemic attack (TIA), and cerebral infarction without residual deficits: Secondary | ICD-10-CM | POA: Diagnosis not present

## 2016-08-11 DIAGNOSIS — E785 Hyperlipidemia, unspecified: Secondary | ICD-10-CM | POA: Diagnosis present

## 2016-08-11 DIAGNOSIS — K922 Gastrointestinal hemorrhage, unspecified: Secondary | ICD-10-CM | POA: Diagnosis present

## 2016-08-11 DIAGNOSIS — R1013 Epigastric pain: Secondary | ICD-10-CM | POA: Diagnosis not present

## 2016-08-11 DIAGNOSIS — Z79899 Other long term (current) drug therapy: Secondary | ICD-10-CM | POA: Diagnosis not present

## 2016-08-11 DIAGNOSIS — Z8711 Personal history of peptic ulcer disease: Secondary | ICD-10-CM | POA: Diagnosis not present

## 2016-08-11 LAB — CBC
HCT: 31.2 % — ABNORMAL LOW (ref 36.0–46.0)
HEMATOCRIT: 33.5 % — AB (ref 36.0–46.0)
HEMATOCRIT: 33.8 % — AB (ref 36.0–46.0)
HEMOGLOBIN: 11.5 g/dL — AB (ref 12.0–15.0)
Hemoglobin: 10.5 g/dL — ABNORMAL LOW (ref 12.0–15.0)
Hemoglobin: 11.3 g/dL — ABNORMAL LOW (ref 12.0–15.0)
MCH: 33.1 pg (ref 26.0–34.0)
MCH: 33.7 pg (ref 26.0–34.0)
MCH: 33.2 pg (ref 26.0–34.0)
MCHC: 34.3 g/dL (ref 30.0–36.0)
MCHC: 33.7 g/dL (ref 30.0–36.0)
MCHC: 33.4 g/dL (ref 30.0–36.0)
MCV: 96.5 fL (ref 78.0–100.0)
MCV: 100 fL (ref 78.0–100.0)
MCV: 99.4 fL (ref 78.0–100.0)
PLATELETS: 197 10*3/uL (ref 150–400)
Platelets: 197 10*3/uL (ref 150–400)
Platelets: 203 10*3/uL (ref 150–400)
RBC: 3.47 MIL/uL — ABNORMAL LOW (ref 3.87–5.11)
RBC: 3.12 MIL/uL — AB (ref 3.87–5.11)
RBC: 3.4 MIL/uL — ABNORMAL LOW (ref 3.87–5.11)
RDW: 13.5 % (ref 11.5–15.5)
RDW: 13.8 % (ref 11.5–15.5)
RDW: 13.8 % (ref 11.5–15.5)
WBC: 6.3 10*3/uL (ref 4.0–10.5)
WBC: 6 10*3/uL (ref 4.0–10.5)
WBC: 6.5 10*3/uL (ref 4.0–10.5)

## 2016-08-11 LAB — COMPREHENSIVE METABOLIC PANEL
ALBUMIN: 3.2 g/dL — AB (ref 3.5–5.0)
ALT: 12 U/L — ABNORMAL LOW (ref 14–54)
ANION GAP: 5 (ref 5–15)
AST: 17 U/L (ref 15–41)
Alkaline Phosphatase: 43 U/L (ref 38–126)
BUN: 28 mg/dL — ABNORMAL HIGH (ref 6–20)
CHLORIDE: 107 mmol/L (ref 101–111)
CO2: 28 mmol/L (ref 22–32)
Calcium: 8.3 mg/dL — ABNORMAL LOW (ref 8.9–10.3)
Creatinine, Ser: 0.75 mg/dL (ref 0.44–1.00)
GFR calc Af Amer: >60 mL/min (ref >60)
GFR calc non Af Amer: >60 mL/min (ref >60)
GLUCOSE: 180 mg/dL — AB (ref 65–99)
POTASSIUM: 3.9 mmol/L (ref 3.5–5.1)
SODIUM: 140 mmol/L (ref 135–145)
Total Bilirubin: 1.2 mg/dL (ref 0.3–1.2)
Total Protein: 5.5 g/dL — ABNORMAL LOW (ref 6.5–8.1)

## 2016-08-11 LAB — GLUCOSE, CAPILLARY
GLUCOSE-CAPILLARY: 177 mg/dL — AB (ref 65–99)
Glucose-Capillary: 242 mg/dL — ABNORMAL HIGH (ref 65–99)
Glucose-Capillary: 161 mg/dL — ABNORMAL HIGH (ref 65–99)
Glucose-Capillary: 124 mg/dL — ABNORMAL HIGH (ref 65–99)
Glucose-Capillary: 157 mg/dL — ABNORMAL HIGH (ref 65–99)
Glucose-Capillary: 219 mg/dL — ABNORMAL HIGH (ref 65–99)

## 2016-08-11 LAB — URINALYSIS, ROUTINE W REFLEX MICROSCOPIC
BILIRUBIN URINE: NEGATIVE
Glucose, UA: 250 mg/dL — AB
Hgb urine dipstick: NEGATIVE
KETONES UR: NEGATIVE mg/dL
Leukocytes, UA: NEGATIVE
NITRITE: NEGATIVE
PROTEIN: NEGATIVE mg/dL
Specific Gravity, Urine: 1.022 (ref 1.005–1.030)
pH: 7.5 (ref 5.0–8.0)

## 2016-08-11 LAB — MRSA PCR SCREENING: MRSA BY PCR: NEGATIVE

## 2016-08-11 MED ORDER — LEVOTHYROXINE SODIUM 75 MCG PO TABS
75.0000 ug | ORAL_TABLET | Freq: Every day | ORAL | Status: DC
  Administered 2016-08-13: 75 ug via ORAL
  Filled 2016-08-11 (×2): qty 1

## 2016-08-11 MED ORDER — ONDANSETRON HCL 4 MG/2ML IJ SOLN
4.0000 mg | Freq: Four times a day (QID) | INTRAMUSCULAR | Status: DC | PRN
  Administered 2016-08-12: 4 mg via INTRAVENOUS
  Filled 2016-08-11: qty 2

## 2016-08-11 MED ORDER — FAMOTIDINE IN NACL 20-0.9 MG/50ML-% IV SOLN
20.0000 mg | Freq: Two times a day (BID) | INTRAVENOUS | Status: DC
  Administered 2016-08-11 – 2016-08-12 (×3): 20 mg via INTRAVENOUS
  Filled 2016-08-11 (×3): qty 50

## 2016-08-11 MED ORDER — INSULIN ASPART 100 UNIT/ML ~~LOC~~ SOLN
0.0000 [IU] | SUBCUTANEOUS | Status: DC
  Administered 2016-08-11: 3 [IU] via SUBCUTANEOUS
  Administered 2016-08-11: 2 [IU] via SUBCUTANEOUS
  Administered 2016-08-11: 3 [IU] via SUBCUTANEOUS
  Administered 2016-08-11 (×2): 2 [IU] via SUBCUTANEOUS
  Administered 2016-08-11 – 2016-08-12 (×2): 1 [IU] via SUBCUTANEOUS
  Administered 2016-08-12: 3 [IU] via SUBCUTANEOUS
  Administered 2016-08-12 – 2016-08-13 (×5): 1 [IU] via SUBCUTANEOUS
  Administered 2016-08-13: 2 [IU] via SUBCUTANEOUS
  Administered 2016-08-13: 1 [IU] via SUBCUTANEOUS

## 2016-08-11 MED ORDER — SODIUM CHLORIDE 0.9 % IV SOLN
INTRAVENOUS | Status: DC
  Administered 2016-08-11: 02:00:00 via INTRAVENOUS

## 2016-08-11 MED ORDER — PANTOPRAZOLE SODIUM 40 MG PO TBEC
40.0000 mg | DELAYED_RELEASE_TABLET | Freq: Every day | ORAL | Status: DC
  Administered 2016-08-12 – 2016-08-13 (×2): 40 mg via ORAL
  Filled 2016-08-11 (×2): qty 1

## 2016-08-11 MED ORDER — DONEPEZIL HCL 10 MG PO TABS
10.0000 mg | ORAL_TABLET | Freq: Every day | ORAL | Status: DC
  Administered 2016-08-11 – 2016-08-12 (×2): 10 mg via ORAL
  Filled 2016-08-11 (×3): qty 1

## 2016-08-11 MED ORDER — ACETAMINOPHEN 650 MG RE SUPP: 650.0000 mg | Freq: Four times a day (QID) | RECTAL | Status: DC | PRN

## 2016-08-11 MED ORDER — ACETAMINOPHEN 325 MG PO TABS
650.0000 mg | ORAL_TABLET | Freq: Four times a day (QID) | ORAL | Status: DC | PRN
  Administered 2016-08-12: 650 mg via ORAL
  Filled 2016-08-11: qty 2

## 2016-08-11 MED ORDER — ONDANSETRON HCL 4 MG PO TABS: 4.0000 mg | ORAL_TABLET | Freq: Four times a day (QID) | ORAL | Status: DC | PRN

## 2016-08-11 MED ORDER — HYDRALAZINE HCL 20 MG/ML IJ SOLN: 10.0000 mg | INTRAMUSCULAR | Status: DC | PRN

## 2016-08-11 NOTE — Progress Notes (Signed)
Report given by previous nurse. Agreed with nurse assessment of patient and will cont to monitor.  

## 2016-08-11 NOTE — Consult Note (Signed)
[facility] Consult: 1:52 PM 08/11/2016  LOS: 0 days    Referring Provider: Dr. Ronalee Belts  Primary Care Physician:  [name], MD Primary Gastroenterologist:  None    Reason for Consultation:  Hematemesis.     HPI: Carol Santiago is a 82 y.o. female.  SNF resident with hx HTN.  HLD.  DM 2.  Dementia.  Question TIA, on ASA and Plavix Gallstones and fatty liver demonstrated on ultrasound in 11/2013. Sigmoid diverticulosis demonstrated on CT pelvis of 01/2016 performed due to fall and question of hip fracture No indications in her medical record that she has had a colonoscopy or an endoscopy.  Early in the day 08/10/16 she experienced epigastric discomfort and later developed hematemesis. In the ER it this was observed as coffee ground emesis.  The discomfort as well as emesis have resolved. His incidence of nausea and vomiting is very unusual for the patient. Patient will sometimes take an extra aspirin in the day before management of arthritic stiffness and pain. The most she takes is 2 aspirin per day. Others notes mention her using Aleve but she denies this for May. Her appetite is generally good. Her weight has been stable, her clothing still fits the same. At arrival she was hemodynamically stable.  Hgb 12.2, dropped as low as 10.5 at 05 30 this morning but is 11.3 at 08 45 this morning  she has not received transfusions.  Platelets normal. Pro-time/INR are normal. BUN is elevated as high as 33 with normal creatinine. Chest x-ray shows probable small left pleural effusion and basilar atelectasis versus infiltrate. Some mild congestive changes along with cardiomegaly.  A description of an EGD was explained to the patient and she was not keen to have this done because she said she thought that it had  been done before and that it was not a good experience.  She denies previous colonoscopy.  She does not take PPI or H2 blocker.    Past Medical History:  Diagnosis Date  . Arthritis   . Dementia   . Diabetes mellitus type 2 with neurological manifestations (HCC)   . Hypertension   . Hypokalemia 11/2013  . Hypothyroid   . Osteoarthritis   . Pneumonia and influenza 11/2013  . TIA (transient ischemic attack)     Past Surgical History:  Procedure Laterality Date  . ABDOMINAL HYSTERECTOMY    . Left knee surgery  2003  . TONSILLECTOMY  1938    Prior to Admission medications   Medication Sig Start Date End Date Taking? Authorizing Provider  aspirin 81 MG tablet Take 81 mg by mouth daily.   Yes Historical Provider, MD  clopidogrel (PLAVIX) 75 MG tablet Take 1 tablet (75 mg total) by mouth daily. 05/26/15  Yes [name], MD  donepezil (ARICEPT) 10 MG tablet Take 1 tablet (10 mg total) by mouth at bedtime. 08/06/16  Yes [name], MD  levothyroxine (SYNTHROID, LEVOTHROID) 75 MCG tablet TAKE ONE TABLET BY MOUTH ONCE DAILY 07/01/16  Yes [name], MD  lisinopril-hydrochlorothiazide (PRINZIDE,ZESTORETIC) 10-12.5 MG  per tablet Take 1 tablet by mouth daily. 05/26/15  Yes Newt Lukes, MD  metFORMIN (GLUCOPHAGE) 500 MG tablet Take 1 tablet (500 mg total) by mouth daily with breakfast. 10/30/15  Yes [name], MD  pravastatin (PRAVACHOL) 20 MG tablet Take 1 tablet (20 mg total) by mouth daily. 05/26/15  Yes [name], MD    Scheduled Meds: . donepezil  10 mg Oral QHS  . famotidine (PEPCID) IV  20 mg Intravenous Q12H  . insulin aspart  0-9 Units Subcutaneous Q4H  . [START ON 08/12/2016] levothyroxine  75 mcg Oral QAC breakfast   Infusions: . sodium chloride 75 mL/hr at 08/11/16 1028   PRN Meds: acetaminophen **OR** acetaminophen, hydrALAZINE, ondansetron **OR** ondansetron (ZOFRAN) IV   Allergies as of 08/10/2016  . (No Known  Allergies)    Family History  Problem Relation Age of Onset  . Arthritis Mother   . Stroke Mother   . Hypertension Mother   . Colon cancer Father   . Lung cancer Brother   . Kidney cancer Brother   . Diabetes Maternal Aunt     Social History   Social History  . Marital status: Divorced    Spouse name: N/A  . Number of children: N/A  . Years of education: N/A   Occupational History  . Not on file.   Social History Main Topics  . Smoking status: Never Smoker  . Smokeless tobacco: Never Used  . Alcohol use No  . Drug use: No  . Sexual activity: Not on file   Other Topics Concern  . Not on file   Social History Narrative  . No narrative on file    REVIEW OF SYSTEMS: Constitutional:  She ambulates a little bit without incidence, using a walker. ENT:  No nose bleeds Pulm:  No shortness of breath or cough. CV:  No palpitations, no LE edema. No chest pain. GU:  No hematuria, no frequency GI:  No dysphagia. Her full set of dentures fit well. No indigestion or heartburn. Heme:  Denies easy bruising or bleeding.   Transfusions:  She does not recall ever having had to take oral iron or having been transfused. Neuro:  No headaches, no peripheral tingling or numbness Derm:  No itching, no rash or sores.  Endocrine:  No sweats or chills.  No polyuria or dysuria. Immunization:  Reviewed. She is up-to-date on the flu shot, it was given 5 days ago. Travel:  None beyond local counties in last few months.    PHYSICAL EXAM: Vital signs in last 24 hours: Vitals:   08/10/16 2356 08/11/16 0502  BP: 108/68 (!) 130/47  Pulse: 61 (!) 59  Resp: 16 18  Temp: 97.7 F (36.5 C) 97.9 F (36.6 C)   Wt Readings from Last 3 Encounters:  08/10/16 76 kg (167 lb 8.8 oz)  08/06/16 76.2 kg (168 lb)  05/07/16 75.8 kg (167 lb)    General: Pleasant, easily humored, alert, aged but not frail over ill appearing Head:  No signs of trauma. Face is symmetric.  Eyes:  No scleral icterus. No  conjunctival pallor. Ears:  Mild loss of hearing.  Nose:  No discharge. No congestion Mouth:  Mucous membranes are moist and clear. Full set of dentures appear to fit well. These were not removed for the exam. Tongue lists slightly to the right Neck:  No JVD, no thyromegaly, no masses. Lungs:  Clear to auscultation and percussion bilaterally. No dyspnea or cough Heart: RRR. No MRG.  S1, S2 audible. Abdomen:  Soft. Without tenderness, masses or HSM. No bruits or obvious hernias..   Rectal: Deferred   Musc/Skeltl: No joint swelling, redness or gross deformity other than some mild kyphosis. Extremities:  No CCE. Capillary refill in the toes is brisk  Neurologic:  Patient is alert, appropriate. She admits readily to having a poor memory. She cannot recall the year, answered 77. She knew that she was in the hospital but not the name of the hospital. She is oriented to herself, name and birthdate Skin:  No open sores, rashes, bruises or other signs of trauma. No suspicious lesions Tattoos:  None Nodes:  No cervical adenopathy.   Psych:  Pleasant, cooperative. In good spirits.  Intake/Output from previous day: 09/26 0701 - 09/27 0700 In: 1457.5 [I.V.:457.5; IV Piggyback:1000] Out: 300 [Urine:300] Intake/Output this shift: Total I/O In: 96.7 [I.V.:96.7] Out: 250 [Urine:250]  LAB RESULTS:  Recent Labs  08/11/16 0132 08/11/16 0538 08/11/16 0845  WBC 6.3 6.0 6.5  HGB 11.5* 10.5* 11.3*  HCT 33.5* 31.2* 33.8*  PLT 197 197 203   BMET Lab Results  Component Value Date   NA 140 08/11/2016   NA 140 08/10/2016   NA 140 08/06/2016   K 3.9 08/11/2016   K 3.9 08/10/2016   K 3.9 08/06/2016   CL 107 08/11/2016   CL 103 08/10/2016   CL 104 08/06/2016   CO2 28 08/11/2016   CO2 30 08/10/2016   CO2 31 08/06/2016   GLUCOSE 180 (H) 08/11/2016   GLUCOSE 230 (H) 08/10/2016   GLUCOSE 195 (H) 08/06/2016   BUN 28 (H) 08/11/2016   BUN 33 (H) 08/10/2016   BUN 28 (H) 08/06/2016   CREATININE  0.75 08/11/2016   CREATININE 0.80 08/10/2016   CREATININE 0.77 08/06/2016   CALCIUM 8.3 (L) 08/11/2016   CALCIUM 9.2 08/10/2016   CALCIUM 9.2 08/06/2016   LFT  Recent Labs  08/10/16 2024 08/11/16 0538  PROT 7.3 5.5*  ALBUMIN 4.1 3.2*  AST 18 17  ALT 15 12*  ALKPHOS 55 43  BILITOT 1.5* 1.2   PT/INR Lab Results  Component Value Date   INR 1.03 08/10/2016   INR 1.00 11/15/2013   Hepatitis Panel No results for input(s): HEPBSAG, HCVAB, HEPAIGM, HEPBIGM in the last 72 hours. C-Diff No components found for: CDIFF Lipase     Component Value Date/Time   LIPASE 61 (H) 08/10/2016 2024    Drugs of Abuse  No results found for: LABOPIA, COCAINSCRNUR, LABBENZ, AMPHETMU, THCU, LABBARB   RADIOLOGY STUDIES: Dg Chest Portable 1 View  Result Date: 08/10/2016 CLINICAL DATA:  82 year old female with chest pain and hematemesis. EXAM: PORTABLE CHEST 1 VIEW COMPARISON:  Chest radiograph dated 11/15/2013 FINDINGS: Single portable view of the chest demonstrates mild cardiomegaly with central vascular prominence compatible with mild congestive changes. A small left pleural effusion may be present. Left lung base airspace density may represent atelectatic changes versus infiltrate. Clinical correlation is recommended. There is no pneumothorax. No acute osseous pathology identified. IMPRESSION: Cardiomegaly with mild congestive changes. Probable small left pleural effusion and left lung base atelectasis versus infiltrate. Clinical correlation is recommended. Electronically Signed   By: Elgie Collard M.D.   On: 08/10/2016 22:24    ENDOSCOPIC STUDIES: Nothing found in Epic records.  IMPRESSION:   *  Acute onset hematemesis/coffee ground emesis.  Stable hemoglobin Patient takes 1-2 full strength aspirin daily, not taking a PPI or H2 blocker at home. No chronic upper or lower GI symptoms. R/o  gastritis, r/o peptic ulcer disease, r/o MWT  *  Chronic aspirin and Plavix for possible history TIA.   These are both currently on hold.    PLAN:     *  Would like to be able to set the patient up for EGD but right now she doesn't want this done. Perhaps after discussing the situation with Dr. Rhea BeltonPyrtle she will change her mind. In the meantime we'll leave her on clear liquids but if she really does refuse EGD her diet can be advanced.  *  I am stopping the Protonix drip and switching her to daily oral PPI    Jennye MoccasinSarah Teyton Pattillo  08/11/2016, 1:52 PM Pager: [phone]

## 2016-08-11 NOTE — Care Management Note (Signed)
Case Management Note  Patient Details  Name: Carol Santiago MRN: [id number] Date of Birth: Apr 29, 1925  Subjective/Objective:   82 y.o. F [facility] admitted with Hematemesis.                  Action/Plan: Anticipate [facility] at [facility]. Will alert CSW of admission from Facility.    Expected Discharge Date:                  Expected Discharge Plan:     In-House Referral:  Clinical Social Work  Discharge planning Services  CM Consult  Post Acute Care Choice:    Choice offered to:     DME Arranged:    DME Agency:     HH Arranged:    HH Agency:     Status of Service:  Completed, signed off  If discussed at [employer] Meetings, dates discussed:    Additional Comments:  Yvone NeuCrutchfield, Jerah Esty M, RN 08/11/2016, 12:06 PM

## 2016-08-11 NOTE — Progress Notes (Signed)
PROGRESS NOTE    Carol Santiago  ZOX:[id number] DOB: [birth year] DOA: 08/10/2016 PCP: [name], MD    Brief Narrative: 82 year old female with history of TIA, hypertension, hyperlipidemia, type 2 diabetes, dementia presented from SNF for hematemesis. Patient was on PPI drip. Hemoglobin is stable today therefore PPI drip discontinued and changed to Pepcid twice a day. GI consulted today. Trying to contact patient's son today, unsuccessful attempts twice.  Assessment & Plan:   # Hematemesis: Possible due to acute upper GI bleed. Reportedly patient had coffee-ground emesis at [facility]. Patient is also on aspirin and Plavix possibly for TIA. Currently on hold both. Patient's hemoglobin is stable today with no further episode of hematemesis in the hospital. I will discontinue heparin drip and switch to Pepcid twice a day. Continue to hold aspirin and Plavix. GI consult called. Monitor CBC. -Start clear liquid diet. Further plan as per gastroenterologist.  #Acute blood loss anemia: More than 1 g drop in hemoglobin on admission likely due to GI blood loss. Hemoglobin today is stable. Continue to monitor.   # Dementia without behavioral issue: Resume home dose of Aricept. Continue safety precaution.   # Hypertension: Continue to monitor blood pressure.    #Diabetes mellitus type 2 with neurological manifestations [facility]): Monitor blood sugar level and cover hyperglycemia with her external dose insulin.  #Hypothyroidism, resume Synthroid.     Continue current medical and supportive care.   DVT prophylaxis: SCD and early ambulation. No anticoagulation because of acute upper GI bleed. Code Status: DO NOT RESUSCITATE Family Communication: Tried to contact patient's son however attempts were unsuccessful Disposition Plan: Likely discharge to SNF in 1-2 days.  Consultants:   GI consulted today   Subjective: Patient was seen and examined at bedside. Patient denied nausea, vomiting,  abdominal pain. Denied chest pain or shortness of breath.   Objective: Vitals:   08/10/16 2308 08/10/16 2330 08/10/16 2356 08/11/16 0502  BP: (!) 137/48 141/55 108/68 (!) 130/47  Pulse: (!) 58 63 61 (!) 59  Resp: 13 15 16 18   Temp:   97.7 F (36.5 C) 97.9 F (36.6 C)  TempSrc:   Oral Oral  SpO2: 98% 100% 100% 100%  Weight:   76 kg (167 lb 8.8 oz)   Height:   5\' 6"  (1.676 m)     Intake/Output Summary (Last 24 hours) at 08/11/16 1333 Last data filed at 08/11/16 1053  Gross per 24 hour  Intake          1554.17 ml  Output              550 ml  Net          1004.17 ml   Filed Weights   08/10/16 2356  Weight: 76 kg (167 lb 8.8 oz)    Examination:  General exam: Appears calm and comfortable  Respiratory system: Clear to auscultation. Respiratory effort normal. Cardiovascular system: S1 & S2 heard, RRR. No pedal edema. Gastrointestinal system: Abdomen is nondistended, soft and nontender. Normal bowel sounds heard. Central nervous system: Alert, Awake, oriented to hospital and 2017. No focal neurological deficits. Extremities: Symmetric 5 x 5 power. Skin: No rashes, lesions or ulcers  Data Reviewed: I have personally reviewed following labs and imaging studies  CBC:  Recent Labs Lab 08/10/16 2024 08/10/16 2220 08/11/16 0132 08/11/16 0538 08/11/16 0845  WBC 6.4 5.8 6.3 6.0 6.5  HGB 12.2 11.6* 11.5* 10.5* 11.3*  HCT 35.8* 34.0* 33.5* 31.2* 33.8*  MCV 96.5 99.4 96.5 100.0 99.4  PLT 208 294 197 197 203   Basic Metabolic Panel:  Recent Labs Lab 08/06/16 1044 08/10/16 2024 08/11/16 0538  NA 140 140 140  K 3.9 3.9 3.9  CL 104 103 107  CO2 31 30 28   GLUCOSE 195* 230* 180*  BUN 28* 33* 28*  CREATININE 0.77 0.80 0.75  CALCIUM 9.2 9.2 8.3*   GFR: Estimated Creatinine Clearance: 49.7 mL/min (by C-G formula based on SCr of 0.75 mg/dL). Liver Function Tests:  Recent Labs Lab 08/06/16 1044 08/10/16 2024 08/11/16 0538  AST 14 18 17   ALT 11 15 12*  ALKPHOS 52  55 43  BILITOT 0.9 1.5* 1.2  PROT 6.8 7.3 5.5*  ALBUMIN 3.6 4.1 3.2*    Recent Labs Lab 08/10/16 2024  LIPASE 61*   No results for input(s): AMMONIA in the last 168 hours. Coagulation Profile:  Recent Labs Lab 08/10/16 2024  INR 1.03   Cardiac Enzymes: No results for input(s): CKTOTAL, CKMB, CKMBINDEX, TROPONINI in the last 168 hours. BNP (last 3 results) No results for input(s): PROBNP in the last 8760 hours. HbA1C: No results for input(s): HGBA1C in the last 72 hours. CBG:  Recent Labs Lab 08/11/16 0216 08/11/16 0458 08/11/16 0822 08/11/16 1205  GLUCAP 242* 161* 124* 157*   Lipid Profile: No results for input(s): CHOL, HDL, LDLCALC, TRIG, CHOLHDL, LDLDIRECT in the last 72 hours. Thyroid Function Tests: No results for input(s): TSH, T4TOTAL, FREET4, T3FREE, THYROIDAB in the last 72 hours. Anemia Panel: No results for input(s): VITAMINB12, FOLATE, FERRITIN, TIBC, IRON, RETICCTPCT in the last 72 hours. Sepsis Labs:  Recent Labs Lab 08/10/16 2051  LATICACIDVEN 1.34    Recent Results (from the past 240 hour(s))  MRSA PCR Screening     Status: None   Collection Time: 08/11/16  1:18 AM  Result Value Ref Range Status   MRSA by PCR NEGATIVE NEGATIVE Final    Comment:        The GeneXpert MRSA Assay (FDA approved for NASAL specimens only), is one component of a comprehensive MRSA colonization surveillance program. It is not intended to diagnose MRSA infection nor to guide or monitor treatment for MRSA infections.          Radiology Studies: Dg Chest Portable 1 View  Result Date: 08/10/2016 CLINICAL DATA:  82 year old female with chest pain and hematemesis. EXAM: PORTABLE CHEST 1 VIEW COMPARISON:  Chest radiograph dated 11/15/2013 FINDINGS: Single portable view of the chest demonstrates mild cardiomegaly with central vascular prominence compatible with mild congestive changes. A small left pleural effusion may be present. Left lung base airspace  density may represent atelectatic changes versus infiltrate. Clinical correlation is recommended. There is no pneumothorax. No acute osseous pathology identified. IMPRESSION: Cardiomegaly with mild congestive changes. Probable small left pleural effusion and left lung base atelectasis versus infiltrate. Clinical correlation is recommended. Electronically Signed   By: Elgie CollardArash  Radparvar M.D.   On: 08/10/2016 22:24        Scheduled Meds: . donepezil  10 mg Oral QHS  . famotidine (PEPCID) IV  20 mg Intravenous Q12H  . insulin aspart  0-9 Units Subcutaneous Q4H  . [START ON 08/12/2016] levothyroxine  75 mcg Oral QAC breakfast   Continuous Infusions: . sodium chloride 75 mL/hr at 08/11/16 1028     LOS: 0 days    Time spent:30 minutes    Kimble Delaurentis Jaynie CollinsPrasad Alexes Menchaca, MD [facility] Pager [phone]  If 7PM-7AM, please contact night-coverage [email or web address] Password [email or web address] 08/11/2016, 1:33 PM

## 2016-08-11 NOTE — H&P (Signed)
History and Physical    Carol DunkerMarjorie Santiago WUJ:[id number] DOB: 1925/03/18 DOA: 08/10/2016  PCP: Myrlene BrokerElizabeth A Crawford, MD  Patient coming from: [facility].  Chief Complaint: Hematemesis.  HPI: Carol DunkerMarjorie Santiago is a 82 y.o. female with TIA, hypertension, hyperlipidemia and diabetes mellitus type 2 with dementia was brought to the ER after patient had an episode of hematemesis. Patient states yesterday patient was not feeling well with some epigastric discomfort. Later on patient had an episode of hematemesis. Patient had another episode in the ER. It was coffee-ground emesis. Patient states she does take Aleve for generalized body ache though not mentioned in the medication list. Patient is also on aspirin and Plavix possibly for TIA. Patient is presently hemodynamically stable. Repeat CBC after the episode of hematemesis showed 1 g drop in hemoglobin. Patient has been admitted for further management of hematemesis.   ED Course: Protonix infusion was started type and screen done.  Review of Systems: As per HPI, rest all negative.   Past Medical History:  Diagnosis Date  . Arthritis   . Dementia   . Diabetes mellitus type 2 with neurological manifestations (HCC)   . Hypertension   . Hypokalemia 11/2013  . Hypothyroid   . Osteoarthritis   . Pneumonia and influenza 11/2013  . TIA (transient ischemic attack)     Past Surgical History:  Procedure Laterality Date  . ABDOMINAL HYSTERECTOMY    . Left knee surgery  2003  . TONSILLECTOMY  1938     reports that she has never smoked. She has never used smokeless tobacco. She reports that she does not drink alcohol or use drugs.  No Known Allergies  Family History  Problem Relation Age of Onset  . Arthritis Mother   . Stroke Mother   . Hypertension Mother   . Colon cancer Father   . Lung cancer Brother   . Kidney cancer Brother   . Diabetes Maternal Aunt     Prior to Admission medications   Medication  Sig Start Date End Date Taking? Authorizing Provider  aspirin 81 MG tablet Take 81 mg by mouth daily.   Yes Historical Provider, MD  clopidogrel (PLAVIX) 75 MG tablet Take 1 tablet (75 mg total) by mouth daily. 05/26/15  Yes Myrlene BrokerElizabeth A Crawford, MD  donepezil (ARICEPT) 10 MG tablet Take 1 tablet (10 mg total) by mouth at bedtime. 08/06/16  Yes Myrlene BrokerElizabeth A Crawford, MD  levothyroxine (SYNTHROID, LEVOTHROID) 75 MCG tablet TAKE ONE TABLET BY MOUTH ONCE DAILY 07/01/16  Yes Myrlene BrokerElizabeth A Crawford, MD  lisinopril-hydrochlorothiazide (PRINZIDE,ZESTORETIC) 10-12.5 MG per tablet Take 1 tablet by mouth daily. 05/26/15  Yes Newt LukesValerie A Leschber, MD  metFORMIN (GLUCOPHAGE) 500 MG tablet Take 1 tablet (500 mg total) by mouth daily with breakfast. 10/30/15  Yes Myrlene BrokerElizabeth A Crawford, MD  pravastatin (PRAVACHOL) 20 MG tablet Take 1 tablet (20 mg total) by mouth daily. 05/26/15  Yes Myrlene BrokerElizabeth A Crawford, MD    Physical Exam: Vitals:   08/10/16 2300 08/10/16 2308 08/10/16 2330 08/10/16 2356  BP: (!) 137/48 (!) 137/48 141/55 108/68  Pulse: (!) 59 (!) 58 63 61  Resp: 15 13 15 16   Temp:    97.7 F (36.5 C)  TempSrc:    Oral  SpO2: 95% 98% 100% 100%  Weight:    167 lb 8.8 oz (76 kg)  Height:    5\' 6"  (1.676 m)      Constitutional: Not in distress. Vitals:   08/10/16 2300 08/10/16 2308 08/10/16 2330  08/10/16 2356  BP: (!) 137/48 (!) 137/48 141/55 108/68  Pulse: (!) 59 (!) 58 63 61  Resp: 15 13 15 16   Temp:    97.7 F (36.5 C)  TempSrc:    Oral  SpO2: 95% 98% 100% 100%  Weight:    167 lb 8.8 oz (76 kg)  Height:    5\' 6"  (1.676 m)   Eyes: Anicteric no pallor. ENMT: No discharge from the ears eyes nose or mouth. Neck: No mass felt. Respiratory: No rhonchi or crepitations. Cardiovascular: S1-S2 heard no murmur appreciated. Abdomen: Soft nontender bowel sounds present. Musculoskeletal: No edema no joint effusions. Skin: No rash. Neurologic: Alert awake oriented to name and place. Moves all  extremities. Psychiatric: Alert awake oriented to her name and place.   Labs on Admission: I have personally reviewed following labs and imaging studies  CBC:  Recent Labs Lab 08/10/16 2024 08/10/16 2220  WBC 6.4 5.8  HGB 12.2 11.6*  HCT 35.8* 34.0*  MCV 96.5 99.4  PLT 208 294   Basic Metabolic Panel:  Recent Labs Lab 08/06/16 1044 08/10/16 2024  NA 140 140  K 3.9 3.9  CL 104 103  CO2 31 30  GLUCOSE 195* 230*  BUN 28* 33*  CREATININE 0.77 0.80  CALCIUM 9.2 9.2   GFR: Estimated Creatinine Clearance: 49.7 mL/min (by C-G formula based on SCr of 0.8 mg/dL). Liver Function Tests:  Recent Labs Lab 08/06/16 1044 08/10/16 2024  AST 14 18  ALT 11 15  ALKPHOS 52 55  BILITOT 0.9 1.5*  PROT 6.8 7.3  ALBUMIN 3.6 4.1    Recent Labs Lab 08/10/16 2024  LIPASE 61*   No results for input(s): AMMONIA in the last 168 hours. Coagulation Profile:  Recent Labs Lab 08/10/16 2024  INR 1.03   Cardiac Enzymes: No results for input(s): CKTOTAL, CKMB, CKMBINDEX, TROPONINI in the last 168 hours. BNP (last 3 results) No results for input(s): PROBNP in the last 8760 hours. HbA1C: No results for input(s): HGBA1C in the last 72 hours. CBG: No results for input(s): GLUCAP in the last 168 hours. Lipid Profile: No results for input(s): CHOL, HDL, LDLCALC, TRIG, CHOLHDL, LDLDIRECT in the last 72 hours. Thyroid Function Tests: No results for input(s): TSH, T4TOTAL, FREET4, T3FREE, THYROIDAB in the last 72 hours. Anemia Panel: No results for input(s): VITAMINB12, FOLATE, FERRITIN, TIBC, IRON, RETICCTPCT in the last 72 hours. Urine analysis:    Component Value Date/Time   COLORURINE YELLOW 01/22/2016 1440   APPEARANCEUR CLOUDY (A) 01/22/2016 1440   LABSPEC 1.021 01/22/2016 1440   PHURINE 5.5 01/22/2016 1440   GLUCOSEU NEGATIVE 01/22/2016 1440   GLUCOSEU NEGATIVE 05/28/2013 1218   HGBUR SMALL (A) 01/22/2016 1440   BILIRUBINUR NEGATIVE 01/22/2016 1440   KETONESUR NEGATIVE  01/22/2016 1440   PROTEINUR NEGATIVE 01/22/2016 1440   UROBILINOGEN 0.2 07/10/2014 1253   NITRITE POSITIVE (A) 01/22/2016 1440   LEUKOCYTESUR MODERATE (A) 01/22/2016 1440   Sepsis Labs: @LABRCNTIP (procalcitonin:4,lacticidven:4) )No results found for this or any previous visit (from the past 240 hour(s)).   Radiological Exams on Admission: Dg Chest Portable 1 View  Result Date: 08/10/2016 CLINICAL DATA:  82 year old female with chest pain and hematemesis. EXAM: PORTABLE CHEST 1 VIEW COMPARISON:  Chest radiograph dated 11/15/2013 FINDINGS: Single portable view of the chest demonstrates mild cardiomegaly with central vascular prominence compatible with mild congestive changes. A small left pleural effusion may be present. Left lung base airspace density may represent atelectatic changes versus infiltrate. Clinical correlation is recommended. There  is no pneumothorax. No acute osseous pathology identified. IMPRESSION: Cardiomegaly with mild congestive changes. Probable small left pleural effusion and left lung base atelectasis versus infiltrate. Clinical correlation is recommended. Electronically Signed   By: Elgie Collard M.D.   On: 08/10/2016 22:24     Assessment/Plan Principal Problem:   Hematemesis Active Problems:   Dementia   Hypertension   Diabetes mellitus type 2 with neurological manifestations (HCC)   Acute blood loss anemia   Acute GI bleeding    1. Hematemesis - for possible upper GI bleed patient has been placed on Protonix infusion. Follow serial CBC. If hemoglobin drops less than 7 or patient becomes hypotensive we will transfuse. Patient is kept nothing by mouth for now. Patient states she does not want any GI procedure but wants [place] to talk to her son Mr. Davey Bergsma for any further plans. I have tried to reach patient's son but was unable to with the number provided in the Epic. Please try to reach patient's son again in the morning. 2. Acute blood loss anemia -  follow CBC. 3. Hypertension - since patient is nothing by mouth I have placed patient on when necessary IV hydralazine. 4. Diabetes mellitus type 2 - will hold off metformin and keep patient on sliding scale coverage for now. 5. Dementia on Aricept which will be continued once patient can take orally. 6. History of TIA - holding her Plavix and aspirin since patient has acute GI bleed. 7. Hyperlipidemia on statins.   DVT prophylaxis: SCDs. Code Status: DO NOT RESUSCITATE.  Family Communication: Try to reach patient's son unable to reach.  Disposition Plan: Back to the facility once stable.  Consults called: None.  Admission status: Inpatient. Likely stay 2 days.    Eduard Clos MD [facility] Pager [phone].  If 7PM-7AM, please contact night-coverage [email or web address] Password [email or web address]  08/11/2016, 1:02 AM

## 2016-08-11 NOTE — Progress Notes (Addendum)
Spoke with patient's daughter in law (lisa) and Son (Crissie SicklesRandal) over the phone. I updated current plan of care. I discussed the GI's recommendation including plan for endoscopy tomorrow. Family has concern about anesthesia and would like to talk to GI regarding the procedure.   Patient's son Crissie Sickles( Randal) stated that he will be available anytime after 7 am tomorrow. His contact no is [phone]. I am trying to contact GI to update my conversation with the family and possibly to call patient's son in the morning.   I discussed this with patient's nurse, Belma.

## 2016-08-11 NOTE — Progress Notes (Signed)
Patient was [facility]. Please order PT eval for disposition recommendation - will need for insurance auth [facility]([employer]) if higher level of care is recommended.    Lincoln MaxinKelly Kelce Bouton, LCSW [facility] Clinical Social Worker cell #: [phone]

## 2016-08-12 ENCOUNTER — Inpatient Hospital Stay (HOSPITAL_COMMUNITY): Payer: Medicare Other | Admitting: Anesthesiology

## 2016-08-12 ENCOUNTER — Encounter (HOSPITAL_COMMUNITY): Payer: Self-pay | Admitting: Anesthesiology

## 2016-08-12 ENCOUNTER — Encounter (HOSPITAL_COMMUNITY)
Admission: EM | Disposition: A | Discharge status: Home Health | Payer: Self-pay | Source: Home / Self Care | Attending: Nephrology

## 2016-08-12 DIAGNOSIS — F0390 Unspecified dementia without behavioral disturbance: Secondary | ICD-10-CM

## 2016-08-12 DIAGNOSIS — K210 Gastro-esophageal reflux disease with esophagitis: Secondary | ICD-10-CM

## 2016-08-12 DIAGNOSIS — K222 Esophageal obstruction: Secondary | ICD-10-CM

## 2016-08-12 DIAGNOSIS — E1149 Type 2 diabetes mellitus with other diabetic neurological complication: Secondary | ICD-10-CM

## 2016-08-12 HISTORY — PX: ESOPHAGOGASTRODUODENOSCOPY: SHX5428

## 2016-08-12 LAB — CBC
HCT: 31.8 % — ABNORMAL LOW (ref 36.0–46.0)
HEMOGLOBIN: 10.7 g/dL — AB (ref 12.0–15.0)
MCH: 33.5 pg (ref 26.0–34.0)
MCHC: 33.6 g/dL (ref 30.0–36.0)
MCV: 99.7 fL (ref 78.0–100.0)
Platelets: 191 10*3/uL (ref 150–400)
RBC: 3.19 MIL/uL — AB (ref 3.87–5.11)
RDW: 13.9 % (ref 11.5–15.5)
WBC: 5 10*3/uL (ref 4.0–10.5)

## 2016-08-12 LAB — GLUCOSE, CAPILLARY
GLUCOSE-CAPILLARY: 148 mg/dL — AB (ref 65–99)
GLUCOSE-CAPILLARY: 142 mg/dL — AB (ref 65–99)
GLUCOSE-CAPILLARY: 220 mg/dL — AB (ref 65–99)
Glucose-Capillary: 111 mg/dL — ABNORMAL HIGH (ref 65–99)
Glucose-Capillary: 122 mg/dL — ABNORMAL HIGH (ref 65–99)
Glucose-Capillary: 131 mg/dL — ABNORMAL HIGH (ref 65–99)

## 2016-08-12 SURGERY — EGD (ESOPHAGOGASTRODUODENOSCOPY)
Anesthesia: Monitor Anesthesia Care

## 2016-08-12 MED ORDER — PROPOFOL 500 MG/50ML IV EMUL
INTRAVENOUS | Status: DC | PRN
  Administered 2016-08-12: 25 ug/kg/min via INTRAVENOUS

## 2016-08-12 MED ORDER — LIDOCAINE HCL 1 % IJ SOLN
INTRAMUSCULAR | Status: AC
  Filled 2016-08-12: qty 80

## 2016-08-12 MED ORDER — PROPOFOL 10 MG/ML IV BOLUS
INTRAVENOUS | Status: AC
  Filled 2016-08-12: qty 20

## 2016-08-12 MED ORDER — LACTATED RINGERS IV SOLN
INTRAVENOUS | Status: DC
  Administered 2016-08-12: 12:00:00 via INTRAVENOUS
  Administered 2016-08-12: 1000 mL via INTRAVENOUS

## 2016-08-12 MED ORDER — LIDOCAINE 2% (20 MG/ML) 5 ML SYRINGE
INTRAMUSCULAR | Status: AC
  Filled 2016-08-12: qty 5

## 2016-08-12 MED ORDER — ONDANSETRON HCL 4 MG/2ML IJ SOLN
INTRAMUSCULAR | Status: AC
  Filled 2016-08-12: qty 2

## 2016-08-12 MED ORDER — GLYCOPYRROLATE 0.2 MG/ML IV SOSY
PREFILLED_SYRINGE | INTRAVENOUS | Status: AC
  Filled 2016-08-12: qty 3

## 2016-08-12 MED ORDER — SODIUM CHLORIDE 0.9 % IV SOLN
INTRAVENOUS | Status: DC
  Administered 2016-08-12: 09:00:00 via INTRAVENOUS

## 2016-08-12 MED ORDER — PROPOFOL 500 MG/50ML IV EMUL
INTRAVENOUS | Status: DC | PRN
  Administered 2016-08-12 (×2): 20 mg via INTRAVENOUS

## 2016-08-12 MED ORDER — GLYCOPYRROLATE 0.2 MG/ML IJ SOLN
INTRAMUSCULAR | Status: DC | PRN
  Administered 2016-08-12: 0.2 mg via INTRAVENOUS

## 2016-08-12 NOTE — Progress Notes (Signed)
Spoke with patient's son Randal via phone.  Consent for EGD obtained.

## 2016-08-12 NOTE — Anesthesia Postprocedure Evaluation (Signed)
Anesthesia Post Note  Patient: Carol Santiago  Procedure(s) Performed: Procedure(s) (LRB): ESOPHAGOGASTRODUODENOSCOPY (EGD) (N/A)  Patient location during evaluation: Endoscopy Anesthesia Type: MAC Level of consciousness: awake and alert Pain management: pain level controlled Vital Signs Assessment: post-procedure vital signs reviewed and stable Respiratory status: spontaneous breathing, nonlabored ventilation, respiratory function stable and patient connected to nasal cannula oxygen Cardiovascular status: stable and blood pressure returned to baseline Anesthetic complications: no    Last Vitals:  Vitals:   08/12/16 1230 08/12/16 1317  BP: (!) 145/43 (!) 130/53  Pulse: 63 (!) 59  Resp: 19 18  Temp:      Last Pain:  Vitals:   08/12/16 1214  TempSrc: Oral  PainSc:                  Shelton SilvasKevin D Loron Weimer

## 2016-08-12 NOTE — Progress Notes (Signed)
PT Cancellation Note  Patient Details Name: Carol Santiago MRN: [id number] DOB: [birth year]   Cancelled Treatment:    Reason Eval/Treat Not Completed: Attempted PT eval-pt declined to participate due to not feeling well. Will check back another time. Thanks.    Rebeca AlertJannie Danielys Madry, MPT Pager: [phone]

## 2016-08-12 NOTE — Anesthesia Preprocedure Evaluation (Addendum)
Anesthesia Evaluation  Patient identified by MRN, date of birth, ID band Patient awake    Reviewed: Allergy & Precautions, NPO status , Patient's Chart, lab work & pertinent test results  Airway Mallampati: II       Dental  (+) Dental Advisory Given   Pulmonary neg pulmonary ROS,    breath sounds clear to auscultation       Cardiovascular hypertension, Pt. on medications  Rhythm:Regular Rate:Normal     Neuro/Psych PSYCHIATRIC DISORDERS TIA   GI/Hepatic negative GI ROS, Neg liver ROS,   Endo/Other  diabetes, Type 2, Oral Hypoglycemic AgentsHypothyroidism   Renal/GU negative Renal ROS  negative genitourinary   Musculoskeletal  (+) Arthritis , Osteoarthritis,    Abdominal   Peds negative pediatric ROS (+)  Hematology negative hematology ROS (+)   Anesthesia Other Findings   Reproductive/Obstetrics negative OB ROS                            Lab Results  Component Value Date   WBC 5.0 08/12/2016   HGB 10.7 (L) 08/12/2016   HCT 31.8 (L) 08/12/2016   MCV 99.7 08/12/2016   PLT 191 08/12/2016   Lab Results  Component Value Date   INR 1.03 08/10/2016   INR 1.00 11/15/2013   07/2016 EKG: normal sinus rhythm.  Anesthesia Physical Anesthesia Plan  ASA: II  Anesthesia Plan: MAC   Post-op Pain Management:    Induction: Intravenous  Airway Management Planned: Natural Airway  Additional Equipment:   Intra-op Plan:   Post-operative Plan:   Informed Consent: I have reviewed the patients History and Physical, chart, labs and discussed the procedure including the risks, benefits and alternatives for the proposed anesthesia with the patient or authorized representative who has indicated his/her understanding and acceptance.     Plan Discussed with: CRNA  Anesthesia Plan Comments:         Anesthesia Quick Evaluation

## 2016-08-12 NOTE — Progress Notes (Signed)
OT Cancellation Note  Patient Details Name: Carol Santiago MRN: [id number] DOB: [birth year]   Cancelled Treatment:    Reason Eval/Treat Not Completed: Other (comment)  Pt not feeling well this am. Will check back. Carol Santiago 08/12/2016, 10:40 AM  Carol Santiago, OTR/L [phone] 08/12/2016

## 2016-08-12 NOTE — Care Management Note (Signed)
Case Management Note  Patient Details  Name: Cleophas DunkerMarjorie Bolar MRN: [id number] Date of Birth: Oct 16, 1925  Subjective/Objective: 82 y/o f admitted w/hematemesis. [facility]. PT cons-await recc.                   Action/Plan:d/c plan home.   Expected Discharge Date:                  Expected Discharge Plan:     In-House Referral:  Clinical Social Work  Discharge planning Services  CM Consult  Post Acute Care Choice:    Choice offered to:     DME Arranged:    DME Agency:     HH Arranged:    HH Agency:     Status of Service:  In process, will continue to follow  If discussed at Long Length of Stay Meetings, dates discussed:    Additional Comments:  Lanier ClamMahabir, Shamika Pedregon, RN 08/12/2016, 2:34 PM

## 2016-08-12 NOTE — Op Note (Signed)
[facility] Patient Name: Carol DunkerMarjorie Santiago Procedure Date: 08/12/2016 MRN: [id number] Attending MD: Beverley FiedlerJay M Ismerai Bin , MD Date of Birth: 09-29-1925 CSN: [id number] Age: 82 Admit Type: Inpatient Procedure:                Upper GI endoscopy Indications:              Hematemesis Providers:                Carie CaddyJay M. Rhea BeltonPyrtle, MD, Omelia BlackwaterShelby Carpenter RN, RN, Lorenda IshiharaSam                            Tetteh, Technician, Greig RightLogan Key, CRNA Referring MD:             [facility] Medicines:                Monitored Anesthesia Care Complications:            No immediate complications. Estimated Blood Loss:     Estimated blood loss: none. Procedure:                Pre-Anesthesia Assessment:                           - Prior to the procedure, a History and Physical                            was performed, and patient medications and                            allergies were reviewed. The patient's tolerance of                            previous anesthesia was also reviewed. The risks                            and benefits of the procedure and the sedation                            options and risks were discussed with the patient.                            All questions were answered, and informed consent                            was obtained. Prior Anticoagulants: The patient has                            taken Plavix (clopidogrel), last dose was 2 days                            prior to procedure. ASA Grade Assessment: III - A                            patient with severe systemic disease. After  reviewing the risks and benefits, the patient was                            deemed in satisfactory condition to undergo the                            procedure.                           After obtaining informed consent, the endoscope was                            passed under direct vision. Throughout the                            procedure, the patient's blood  pressure, pulse, and                            oxygen saturations were monitored continuously. The                            Endoscope was introduced through the mouth, and                            advanced to the second part of duodenum. The upper                            GI endoscopy was accomplished without difficulty.                            The patient tolerated the procedure well. Scope In: Scope Out: Findings:      Reflux esophagitis with an ulceration was found at the gastroesophageal       junction. Not currently bleeding. This was not biopsied due to recent       Plavix use, but appeared benign.      A mild Schatzki ring (acquired) was found at the gastroesophageal       junction (38 cm from the incisors).      A 2 cm hiatal hernia was present.      Scattered mild inflammation characterized by linear erosions was found       in the gastric body and in the gastric antrum. Again, not biopsied due       to Plavix use.      The examined duodenum was normal. Impression:               - Acute reflux esophagitis with ulceration at GE                            junction (likely source of recent hematemesis).                           - Mild Schatzki ring.                           - 2 cm hiatal hernia.                           -  Mild nonbleeding gastritis.                           - Normal examined duodenum. Moderate Sedation:      N/A- Per Anesthesia Care Recommendation:           - Return patient to hospital ward for ongoing care.                           - Resume previous diet.                           - Continue present medications including once daily                            PPI                           - Would hold Plavix an additional 7 days. resuming                            felt okay from GI perspective, but risk benefit can                            be discussed with prescribing provider. If Plavix                            continues would recommend  avoiding ASA and NSAIDs. Procedure Code(s):        --- Professional ---                           [phone], Esophagogastroduodenoscopy, flexible,                            transoral; diagnostic, including collection of                            specimen(s) by brushing or washing, when performed                            (separate procedure) Diagnosis Code(s):        --- Professional ---                           K21.0, Gastro-esophageal reflux disease with                            esophagitis                           K22.2, Esophageal obstruction                           K44.9, Diaphragmatic hernia without obstruction or                            gangrene  K29.70, Gastritis, unspecified, without bleeding                           K92.0, Hematemesis CPT copyright 2016 American Medical Association. All rights reserved. The codes documented in this report are preliminary and upon coder review may  be revised to meet current compliance requirements. Beverley Fiedler, MD 08/12/2016 12:30:16 PM This report has been signed electronically. Number of Addenda: 0

## 2016-08-12 NOTE — Transfer of Care (Signed)
Immediate Anesthesia Transfer of Care Note  Patient: Cleophas DunkerMarjorie Fonte  Procedure(s) Performed: Procedure(s): ESOPHAGOGASTRODUODENOSCOPY (EGD) (N/A)  Patient Location: PACU  Anesthesia Type:MAC  Level of Consciousness:  sedated, patient cooperative and responds to stimulation  Airway & Oxygen Therapy:Patient Spontanous Breathing and Patient connected to face mask oxgen  Post-op Assessment:  Report given to PACU RN and Post -op Vital signs reviewed and stable  Post vital signs:  Reviewed and stable  Last Vitals:  Vitals:   08/12/16 0452 08/12/16 1128  BP: (!) 145/55 (!) 171/71  Pulse: 60 (!) 54  Resp: 16 16  Temp: (!) 36 C     Complications: No apparent anesthesia complications

## 2016-08-12 NOTE — Progress Notes (Signed)
PROGRESS NOTE    Carol DunkerMarjorie Bowerman  XLK:[id number] DOB: April 17, 1925 DOA: 08/10/2016 PCP: Myrlene BrokerElizabeth A Crawford, MD    Brief Narrative: 82 year old female with history of TIA, hypertension, hyperlipidemia, type 2 diabetes, dementia presented from SNF for hematemesis. Patient was on PPI drip. On PPI. S/p EGD by GI today showing esophagitis. Monitor H/H.  Assessment & Plan:   # Hematemesis/ acute upper GI bleed. [facility] Patient is also on aspirin and Plavix possibly for TIA. Currently on hold both. Patient was evaluated by gastroenterologist and underwent upper GI endoscopy today. Endoscopy showed acute reflux esophagitis with ulceration at the gastroesophageal junction which is likely the source of recent hematemesis. Continue Protonix. GI recommended to hold Plavix for at least a week. Discontinue aspirin and any NSAIDs. Repeat CBC in the morning. -soft diet  #Acute blood loss anemia: monitor Hb. Continue to monitor. Repeat CBC in am.   # Dementia without behavioral issue: Continue home dose of Aricept. Continue safety precaution.   # Hypertension: Continue to monitor blood pressure. Monitor heart rate. Bradycardia today likely during the procedure.     #Diabetes mellitus type 2 with neurological manifestations [facility](HCC): Blood sugar level acceptable. Continue to monitor blood sugar level and cover hyperglycemia with her correctional dose insulin.  #Hypothyroidism, continue Synthroid.    Continue current medical and supportive care as ordered.   DVT prophylaxis: SCD and early ambulation. No anticoagulation because of acute upper GI bleed. Code Status: DO NOT RESUSCITATE Family Communication: Discussed with the patient's son and daughter-in-law yesterday.  Disposition Plan: Likely discharge to SNF in 1-2 days.  Consultants:   GI consulted    Subjective: Patient was seen and examined at bedside. Patient reported abdominal discomfort. Denied  nausea, vomiting, chest pain or shortness of breath. Review of systems Limited because of her underlying dementia.  Objective: Vitals:   08/12/16 1215 08/12/16 1220 08/12/16 1230 08/12/16 1317  BP: (!) 138/39 (!) 141/40 (!) 145/43 (!) 130/53  Pulse: 64 62 63 (!) 59  Resp: 18 17 19 18   Temp:      TempSrc:      SpO2: 100% 100% 96% 100%  Weight:      Height:        Intake/Output Summary (Last 24 hours) at 08/12/16 1351 Last data filed at 08/12/16 1317  Gross per 24 hour  Intake              910 ml  Output             2350 ml  Net            -1440 ml   Filed Weights   08/10/16 2356  Weight: 76 kg (167 lb 8.8 oz)    Examination:  General exam: Not apparent distress. Lying comfortably on bed.  Respiratory system: Good air entry bilateral, no crackles appreciated.. Cardiovascular system: Regular rate and rhythm, S1-S2 normal. No lower extremity edema. Gastrointestinal system: Abdomen soft, bowel sounds positive, nontender. Central nervous system: Alert, awake and following commands.  Extremities: Symmetric 5 x 5 power. Skin: No rashes, lesions or ulcers  Data Reviewed: I have personally reviewed following labs and imaging studies  CBC:  Recent Labs Lab 08/10/16 2220 08/11/16 0132 08/11/16 0538 08/11/16 0845 08/12/16 0946  WBC 5.8 6.3 6.0 6.5 5.0  HGB 11.6* 11.5* 10.5* 11.3* 10.7*  HCT 34.0* 33.5* 31.2* 33.8* 31.8*  MCV 99.4 96.5 100.0 99.4 99.7  PLT 294 197 197 203 191   Basic Metabolic  Panel:  Recent Labs Lab 08/06/16 1044 08/10/16 2024 08/11/16 0538  NA 140 140 140  K 3.9 3.9 3.9  CL 104 103 107  CO2 31 30 28   GLUCOSE 195* 230* 180*  BUN 28* 33* 28*  CREATININE 0.77 0.80 0.75  CALCIUM 9.2 9.2 8.3*   GFR: Estimated Creatinine Clearance: 49.7 mL/min (by C-G formula based on SCr of 0.75 mg/dL). Liver Function Tests:  Recent Labs Lab 08/06/16 1044 08/10/16 2024 08/11/16 0538  AST 14 18 17   ALT 11 15 12*  ALKPHOS 52 55 43  BILITOT 0.9 1.5* 1.2    PROT 6.8 7.3 5.5*  ALBUMIN 3.6 4.1 3.2*    Recent Labs Lab 08/10/16 2024  LIPASE 61*   No results for input(s): AMMONIA in the last 168 hours. Coagulation Profile:  Recent Labs Lab 08/10/16 2024  INR 1.03   Cardiac Enzymes: No results for input(s): CKTOTAL, CKMB, CKMBINDEX, TROPONINI in the last 168 hours. BNP (last 3 results) No results for input(s): PROBNP in the last 8760 hours. HbA1C: No results for input(s): HGBA1C in the last 72 hours. CBG:  Recent Labs Lab 08/11/16 1656 08/11/16 2144 08/12/16 0107 08/12/16 0441 08/12/16 0721  GLUCAP 177* 219* 111* 148* 142*   Lipid Profile: No results for input(s): CHOL, HDL, LDLCALC, TRIG, CHOLHDL, LDLDIRECT in the last 72 hours. Thyroid Function Tests: No results for input(s): TSH, T4TOTAL, FREET4, T3FREE, THYROIDAB in the last 72 hours. Anemia Panel: No results for input(s): VITAMINB12, FOLATE, FERRITIN, TIBC, IRON, RETICCTPCT in the last 72 hours. Sepsis Labs:  Recent Labs Lab 08/10/16 2051  LATICACIDVEN 1.34    Recent Results (from the past 240 hour(s))  MRSA PCR Screening     Status: None   Collection Time: 08/11/16  1:18 AM  Result Value Ref Range Status   MRSA by PCR NEGATIVE NEGATIVE Final    Comment:        The GeneXpert MRSA Assay (FDA approved for NASAL specimens only), is one component of a comprehensive MRSA colonization surveillance program. It is not intended to diagnose MRSA infection nor to guide or monitor treatment for MRSA infections.          Radiology Studies: Dg Chest Portable 1 View  Result Date: 08/10/2016 CLINICAL DATA:  82 year old female with chest pain and hematemesis. EXAM: PORTABLE CHEST 1 VIEW COMPARISON:  Chest radiograph dated 11/15/2013 FINDINGS: Single portable view of the chest demonstrates mild cardiomegaly with central vascular prominence compatible with mild congestive changes. A small left pleural effusion may be present. Left lung base airspace density may  represent atelectatic changes versus infiltrate. Clinical correlation is recommended. There is no pneumothorax. No acute osseous pathology identified. IMPRESSION: Cardiomegaly with mild congestive changes. Probable small left pleural effusion and left lung base atelectasis versus infiltrate. Clinical correlation is recommended. Electronically Signed   By: Elgie Collard M.D.   On: 08/10/2016 22:24        Scheduled Meds: . donepezil  10 mg Oral QHS  . famotidine (PEPCID) IV  20 mg Intravenous Q12H  . insulin aspart  0-9 Units Subcutaneous Q4H  . levothyroxine  75 mcg Oral QAC breakfast  . pantoprazole  40 mg Oral Q0600   Continuous Infusions:     LOS: 1 day    Time spent:26 minutes    Dron Jaynie Collins, MD [facility] Pager [phone]  If 7PM-7AM, please contact night-coverage [email or web address] Password [email or web address] 08/12/2016, 1:51 PM

## 2016-08-13 ENCOUNTER — Encounter (HOSPITAL_COMMUNITY): Payer: Self-pay | Admitting: Internal Medicine

## 2016-08-13 LAB — CBC
HCT: 32 % — ABNORMAL LOW (ref 36.0–46.0)
Hemoglobin: 10.8 g/dL — ABNORMAL LOW (ref 12.0–15.0)
MCH: 33.4 pg (ref 26.0–34.0)
MCHC: 33.8 g/dL (ref 30.0–36.0)
MCV: 99.1 fL (ref 78.0–100.0)
Platelets: 181 10*3/uL (ref 150–400)
RBC: 3.23 MIL/uL — ABNORMAL LOW (ref 3.87–5.11)
RDW: 13.7 % (ref 11.5–15.5)
WBC: 5.7 10*3/uL (ref 4.0–10.5)

## 2016-08-13 LAB — GLUCOSE, CAPILLARY
GLUCOSE-CAPILLARY: 162 mg/dL — AB (ref 65–99)
Glucose-Capillary: 149 mg/dL — ABNORMAL HIGH (ref 65–99)
Glucose-Capillary: 124 mg/dL — ABNORMAL HIGH (ref 65–99)
Glucose-Capillary: 128 mg/dL — ABNORMAL HIGH (ref 65–99)

## 2016-08-13 MED ORDER — PANTOPRAZOLE SODIUM 40 MG PO TBEC: 40.0000 mg | DELAYED_RELEASE_TABLET | Freq: Every day | ORAL | 0 refills | Status: DC

## 2016-08-13 MED ORDER — POLYETHYLENE GLYCOL 3350 17 G PO PACK: 17.0000 g | PACK | Freq: Every day | ORAL | 0 refills | Status: DC

## 2016-08-13 MED ORDER — CLOPIDOGREL BISULFATE 75 MG PO TABS: 75.0000 mg | ORAL_TABLET | Freq: Every day | ORAL | 0 refills | Status: AC

## 2016-08-13 NOTE — Discharge Summary (Addendum)
Physician Discharge Summary  Carol Santiago XBJ:[id number] DOB: 1925-03-29 DOA: 08/10/2016  PCP: Carol Broker, MD  Admit date: 08/10/2016 Discharge date: 08/13/2016  Admitted From:  Disposition:   Recommendations for Outpatient Follow-up:  1. Follow up with PCP in 1-2 weeks 2. Please obtain BMP/CBC in one week  Home Health: Yes. Discussed with CM regarding Home PT and 24 hours supervision/ Equipment/Devices:none  Discharge Condition:stable CODE STATUS:DNR Diet recommendation: heart healthy  Brief/Interim Summary:82 year old female with history of TIA, hypertension, hyperlipidemia, type 2 diabetes and mild dementia without behavioral issue presented with hematemesis. Patient was initially treated with Protonix drip. Patient was evaluated by gastroenterologist and underwent upper GI endoscopy. The endoscopy consistent with acute reflux esophagitis with ulceration which is probably the source of hematemesis. No active bleeding noticed. No further episode of upper GI bleed in the hospital. Hemoglobin is stable. Started oral Protonix. Patient is able to tolerate diet well.  Patient was evaluated by PT, OT, social worker and case Production designer, theatre/television/film. PT and OT recommended home physical therapy with 24-hour supervision. I discussed this with the case manager today regarding the discharge plan. Patient already has home care.  In the hospital she was managed for acute blood loss anemia, dementia without behavioral issue, hypertension, type 2 diabetes and hypothyroidism. I advised her to follow-up with her PCP.  I called patient's son Carol Santiago [phone]) today and discussed clinical condition and plan of care including discharge. I discussed with him regarding the medication changes, endoscopy finding and plan to discharge home with home care. He verbalized understanding.  Patient is medically stable to discharge with close outpatient follow-up.Ordered MiraLAX for the management of  constipation.   Discharge Diagnoses:  Principal Problem:   Hematemesis Active Problems:   Dementia   Hypertension   Diabetes mellitus type 2 with neurological manifestations (HCC)   Acute blood loss anemia   Acute GI bleeding    Discharge Instructions  Discharge Instructions    Call MD for:  difficulty breathing, headache or visual disturbances    Complete by:  As directed    Call MD for:  persistant dizziness or light-headedness    Complete by:  As directed    Call MD for:  persistant nausea and vomiting    Complete by:  As directed    Diet - low sodium heart healthy    Complete by:  As directed    Discharge instructions    Complete by:  As directed    Please discontnue Aspirin Hold Plaxis for 2 days and then resume it Follow up with PCP   Increase activity slowly    Complete by:  As directed        Medication List    STOP taking these medications   aspirin 81 MG tablet     TAKE these medications   clopidogrel 75 MG tablet Commonly known as:  PLAVIX Take 1 tablet (75 mg total) by mouth daily. Please don't take for one week. Start taking on:  08/20/2016 What changed:  additional instructions   donepezil 10 MG tablet Commonly known as:  ARICEPT Take 1 tablet (10 mg total) by mouth at bedtime.   levothyroxine 75 MCG tablet Commonly known as:  SYNTHROID, LEVOTHROID TAKE ONE TABLET BY MOUTH ONCE DAILY   lisinopril-hydrochlorothiazide 10-12.5 MG tablet Commonly known as:  PRINZIDE,ZESTORETIC Take 1 tablet by mouth daily.   metFORMIN 500 MG tablet Commonly known as:  GLUCOPHAGE Take 1 tablet (500 mg total) by mouth daily with breakfast.   pantoprazole 40  MG tablet Commonly known as:  PROTONIX Take 1 tablet (40 mg total) by mouth daily at 6 (six) AM. Start taking on:  08/14/2016   polyethylene glycol packet Commonly known as:  MIRALAX / GLYCOLAX Take 17 g by mouth daily. Hold for diarrhea   pravastatin 20 MG tablet Commonly known as:  PRAVACHOL Take  1 tablet (20 mg total) by mouth daily.      Follow-up Information    Carol Broker, MD. Schedule an appointment as soon as possible for a visit in 1 week(s).   Specialty:  Internal Medicine Contact information: [address] [place] [address] [phone]          No Known Allergies  Consultations:  Procedures/Studies: Dg Chest Portable 1 View  Result Date: 08/10/2016 CLINICAL DATA:  82 year old female with chest pain and hematemesis. EXAM: PORTABLE CHEST 1 VIEW COMPARISON:  Chest radiograph dated 11/15/2013 FINDINGS: Single portable view of the chest demonstrates mild cardiomegaly with central vascular prominence compatible with mild congestive changes. A small left pleural effusion may be present. Left lung base airspace density may represent atelectatic changes versus infiltrate. Clinical correlation is recommended. There is no pneumothorax. No acute osseous pathology identified. IMPRESSION: Cardiomegaly with mild congestive changes. Probable small left pleural effusion and left lung base atelectasis versus infiltrate. Clinical correlation is recommended. Electronically Signed   By: Elgie Collard M.D.   On: 08/10/2016 22:24     Subjective:Patient was seen and examined at bedside. Patient denied headache, dizziness, nausea, vomiting, chest pain, shortness of breath. Denied abdominal pain. Patient is eager to go home today.   Discharge Exam: Vitals:   08/12/16 2044 08/13/16 0507  BP: 103/90 (!) 138/47  Pulse: 69 87  Resp: 14 16  Temp: 98.7 F (37.1 C) 98.3 F (36.8 C)   Vitals:   08/12/16 1230 08/12/16 1317 08/12/16 2044 08/13/16 0507  BP: (!) 145/43 (!) 130/53 103/90 (!) 138/47  Pulse: 63 (!) 59 69 87  Resp: 19 18 14 16   Temp:   98.7 F (37.1 C) 98.3 F (36.8 C)  TempSrc:   Oral Oral  SpO2: 96% 100% 97% 94%  Weight:      Height:        General: Pt is alert, awake, not in acute distress Cardiovascular: RRR, S1/S2 +, no rubs, no  gallops Respiratory: CTA bilaterally, no wheezing, no rhonchi Abdominal: Soft, NT, ND, bowel sounds + Extremities: no edema, no cyanosis    The results of significant diagnostics from this hospitalization (including imaging, microbiology, ancillary and laboratory) are listed below for reference.     Microbiology: Recent Results (from the past 240 hour(s))  MRSA PCR Screening     Status: None   Collection Time: 08/11/16  1:18 AM  Result Value Ref Range Status   MRSA by PCR NEGATIVE NEGATIVE Final    Comment:        The GeneXpert MRSA Assay (FDA approved for NASAL specimens only), is one component of a comprehensive MRSA colonization surveillance program. It is not intended to diagnose MRSA infection nor to guide or monitor treatment for MRSA infections.      Labs: BNP (last 3 results) No results for input(s): BNP in the last 8760 hours. Basic Metabolic Panel:  Recent Labs Lab 08/10/16 2024 08/11/16 0538  NA 140 140  K 3.9 3.9  CL 103 107  CO2 30 28  GLUCOSE 230* 180*  BUN 33* 28*  CREATININE 0.80 0.75  CALCIUM 9.2 8.3*   Liver  Function Tests:  Recent Labs Lab 08/10/16 2024 08/11/16 0538  AST 18 17  ALT 15 12*  ALKPHOS 55 43  BILITOT 1.5* 1.2  PROT 7.3 5.5*  ALBUMIN 4.1 3.2*    Recent Labs Lab 08/10/16 2024  LIPASE 61*   No results for input(s): AMMONIA in the last 168 hours. CBC:  Recent Labs Lab 08/11/16 0132 08/11/16 0538 08/11/16 0845 08/12/16 0946 08/13/16 0845  WBC 6.3 6.0 6.5 5.0 5.7  HGB 11.5* 10.5* 11.3* 10.7* 10.8*  HCT 33.5* 31.2* 33.8* 31.8* 32.0*  MCV 96.5 100.0 99.4 99.7 99.1  PLT 197 197 203 191 181   Cardiac Enzymes: No results for input(s): CKTOTAL, CKMB, CKMBINDEX, TROPONINI in the last 168 hours. BNP: Invalid input(s): POCBNP CBG:  Recent Labs Lab 08/12/16 2039 08/12/16 2342 08/13/16 0503 08/13/16 0748 08/13/16 1143  GLUCAP 131* 122* 149* 124* 128*   D-Dimer No results for input(s): DDIMER in the last  72 hours. Hgb A1c No results for input(s): HGBA1C in the last 72 hours. Lipid Profile No results for input(s): CHOL, HDL, LDLCALC, TRIG, CHOLHDL, LDLDIRECT in the last 72 hours. Thyroid function studies No results for input(s): TSH, T4TOTAL, T3FREE, THYROIDAB in the last 72 hours.  Invalid input(s): FREET3 Anemia work up No results for input(s): VITAMINB12, FOLATE, FERRITIN, TIBC, IRON, RETICCTPCT in the last 72 hours. Urinalysis    Component Value Date/Time   COLORURINE YELLOW 08/11/2016 0119   APPEARANCEUR CLEAR 08/11/2016 0119   LABSPEC 1.022 08/11/2016 0119   PHURINE 7.5 08/11/2016 0119   GLUCOSEU 250 (A) 08/11/2016 0119   GLUCOSEU NEGATIVE 05/28/2013 1218   HGBUR NEGATIVE 08/11/2016 0119   BILIRUBINUR NEGATIVE 08/11/2016 0119   KETONESUR NEGATIVE 08/11/2016 0119   PROTEINUR NEGATIVE 08/11/2016 0119   UROBILINOGEN 0.2 07/10/2014 1253   NITRITE NEGATIVE 08/11/2016 0119   LEUKOCYTESUR NEGATIVE 08/11/2016 0119   Sepsis Labs Invalid input(s): PROCALCITONIN,  WBC,  LACTICIDVEN Microbiology Recent Results (from the past 240 hour(s))  MRSA PCR Screening     Status: None   Collection Time: 08/11/16  1:18 AM  Result Value Ref Range Status   MRSA by PCR NEGATIVE NEGATIVE Final    Comment:        The GeneXpert MRSA Assay (FDA approved for NASAL specimens only), is one component of a comprehensive MRSA colonization surveillance program. It is not intended to diagnose MRSA infection nor to guide or monitor treatment for MRSA infections.      Time coordinating discharge: Over 30 minutes  SIGNED:   Maxie Barbron Prasad Beola Vasallo, MD  [facility] 08/13/2016, 1:35 PM

## 2016-08-13 NOTE — Progress Notes (Signed)
Spoke with patient; she did not remember/was confused about what occurred during EGD yesterday.  Again explained the findings from EGD.  Nothing was removed/fixed at the time of procedure but we have her on an acid medication (PPI) once daily to help heal the ulcer area in the lower part of the esophagus.  She seemed to understand.  Signing off from GI standpoint.

## 2016-08-13 NOTE — Care Management Note (Signed)
Case Management Note  Patient Details  Name: Cleophas DunkerMarjorie Linck MRN: [id number] Date of Birth: 1925/08/31  Subjective/Objective:   Spoke to son on phone Brynda GreathouseRandall about d/c plans-[facility]confirmation to fax#[phone] h&P,d/c order,HHC orders. Provided also private duty care agency list(family already familiar with 24 private duty care list(independent decision, & out of pocket cost) will leave in shadow chart & nurse will give to family upon d/c [facility]. No further CM needs.                 Action/Plan:d/c home w/HHC.   Expected Discharge Date:                  Expected Discharge Plan:     In-House Referral:  Clinical Social Work  Discharge planning Services  CM Consult  Post Acute Care Choice:    Choice offered to:     DME Arranged:    DME Agency:     HH Arranged:  PT, OT HH Agency:  Other - See comment International aid/development worker([facility])  Status of Service:  Completed, signed off  If discussed at Long Length of Stay Meetings, dates discussed:    Additional Comments:  Lanier ClamMahabir, Saajan Willmon, RN 08/13/2016, 3:07 PM

## 2016-08-13 NOTE — Evaluation (Signed)
Physical Therapy Evaluation Patient Details Name: Carol Santiago MRN: [id number] DOB: Nov 21, 1924 Today's Date: 08/13/2016   History of Present Illness  82 yo female admitted with hematemesis. Hx of dementia  Clinical Impression  On eval, pt required Min assist for mobility. She walked ~115 feet with a RW. Pt was unsteady at times and required cueing for safety. Recommend pt have some sort of supervision when she returns home for safety.     Follow Up Recommendations Home health PT;Supervision/Assistance - 24 hour (if possible. Would recommend some sort of supervision for safety)    Equipment Recommendations  None recommended by PT    Recommendations for Other Services       Precautions / Restrictions Precautions Precautions: Fall Restrictions Weight Bearing Restrictions: No      Mobility  Bed Mobility Overal bed mobility: Needs Assistance Bed Mobility: Supine to Sit     Supine to sit: Min assist     General bed mobility comments: Assist for trunk and to get to EOB. Increased time.   Transfers Overall transfer level: Needs assistance Equipment used: Rolling walker (2 wheeled) Transfers: Sit to/from Stand Sit to Stand: Min guard         General transfer comment: close guard for safety  Ambulation/Gait Ambulation/Gait assistance: Min assist Ambulation Distance (Feet): 115 Feet Assistive device: Rolling walker (2 wheeled) Gait Pattern/deviations: Step-through pattern;Decreased stride length     General Gait Details: Intermittent assist to stabilize. slow gait speed. cues for safety, distance from walker.   Stairs            Wheelchair Mobility    Modified Rankin (Stroke Patients Only)       Balance Overall balance assessment: Needs assistance           Standing balance-Leahy Scale: Fair Standing balance comment: uses RW for ambulation. Pt was able to stand statically in bathroom for toileting hygiene/washing hands.                             Pertinent Vitals/Pain Pain Assessment: No/denies pain    Home Living Family/patient expects to be discharged to:: Private residence Living Arrangements: Alone   Type of Home: Independent living facility Home Access: Level entry     Home Layout: One level Home Equipment: Environmental consultant - 2 wheels      Prior Function Level of Independence: Independent with assistive device(s)               Hand Dominance        Extremity/Trunk Assessment   Upper Extremity Assessment: Defer to OT evaluation           Lower Extremity Assessment: Generalized weakness      Cervical / Trunk Assessment: Kyphotic  Communication   Communication: No difficulties  Cognition Arousal/Alertness: Awake/alert Behavior During Therapy: WFL for tasks assessed/performed Overall Cognitive Status: History of cognitive impairments - at baseline                      General Comments      Exercises     Assessment/Plan    PT Assessment Patient needs continued PT services  PT Problem List Decreased strength;Decreased mobility;Decreased activity tolerance;Decreased balance;Decreased knowledge of use of DME          PT Treatment Interventions DME instruction;Gait training;Functional mobility training;Therapeutic activities;Therapeutic exercise;Patient/family education    PT Goals (Current goals can be found in the Care Plan section)  Acute Rehab  PT Goals Patient Stated Goal: home PT Goal Formulation: With patient Time For Goal Achievement: 08/27/16 Potential to Achieve Goals: Good    Frequency Min 3X/week   Barriers to discharge Decreased caregiver support      Co-evaluation               End of Session Equipment Utilized During Treatment: Gait belt Activity Tolerance: Patient tolerated treatment well Patient left: in bed;with call bell/phone within reach           Time: 1013-1032 PT Time Calculation (min) (ACUTE ONLY): 19 min   Charges:   PT  Evaluation $PT Eval Low Complexity: 1 Procedure     PT G Codes:        Rebeca AlertJannie Lethia Donlon, MPT Pager: [phone]

## 2016-08-13 NOTE — Evaluation (Signed)
Occupational Therapy Evaluation Patient Details Name: Carol Santiago MRN: [id number] DOB: [birth year] Today's Date: 08/13/2016    History of Present Illness 82 yo female admitted with hematemesis. Hx of dementia   Clinical Impression    This pt was admitted for the above. She is from independent living.  Unsure if she has any assistance at baseline. She currently needs min guard to min A for adls and mobility.  Goals in acute are for supervision level    Follow Up Recommendations  Home health OT;Supervision/Assistance - 24 hour (assistance/supervision for adls/mobility)    Equipment Recommendations   (defer to HHOT, possibly 3:1 if toilet not high enough)    Recommendations for Other Services       Precautions / Restrictions Precautions Precautions: Fall Restrictions Weight Bearing Restrictions: No      Mobility Bed Mobility Overal bed mobility: Needs Assistance Bed Mobility: Supine to Sit     Supine to sit: Min assist Sit to supine: Min guard   General bed mobility comments: pt brings knee up onto bed, aims head towards bottom, sits up then lays towards HOB.  Unable to redirect to sit and lay down.  She states "this is the way I do it"  Transfers Overall transfer level: Needs assistance Equipment used: Rolling walker (2 wheeled) Transfers: Sit to/from Stand Sit to Stand: Min guard         General transfer comment: close guard for safety.  Pt tends to push walker too far away when standing and pulls up on walker.  ? if she has 4 wheel walker at home and if she sets brakes    Balance Overall balance assessment: Needs assistance           Standing balance-Leahy Scale: Fair Standing balance comment: min guard for safety; unpredictable at times                            ADL Overall ADL's : Needs assistance/impaired     Grooming: Oral care;Supervision/safety;Standing (cues)   Upper Body Bathing: Supervision/ safety;Sitting   Lower Body  Bathing: Min guard;Sit to/from stand   Upper Body Dressing : Set up;Sitting   Lower Body Dressing: Minimal assistance;Sit to/from stand (for sock, tight)   Toilet Transfer: Minimal assistance;Ambulation;RW (back to bed)             General ADL Comments: pt lost track of what she was doing when brushing teeth. Recommend supervision/cues for thoroughness and safety.  Pt left walker when ambulating back to bed.  Min A for safety/steadiness.  Of note, pt had strong urine smell.       Vision     Perception     Praxis      Pertinent Vitals/Pain Pain Assessment: No/denies pain     Hand Dominance     Extremity/Trunk Assessment Upper Extremity Assessment Upper Extremity Assessment: Overall WFL for tasks assessed   Lower Extremity Assessment Lower Extremity Assessment: Generalized weakness   Cervical / Trunk Assessment Cervical / Trunk Assessment: Kyphotic   Communication Communication Communication: No difficulties   Cognition Arousal/Alertness: Awake/alert Behavior During Therapy: WFL for tasks assessed/performed Overall Cognitive Status: History of cognitive impairments - at baseline                     General Comments       Exercises       Shoulder Instructions      Home Living Family/patient expects to  be discharged to:: Private residence Living Arrangements: Alone   Type of Home: Independent living facility Home Access: Level entry     Home Layout: One level               Home Equipment: Environmental consultant - 2 wheels   Additional Comments: unsure of set up; from [facility], independent living      Prior Functioning/Environment Level of Independence: Independent with assistive device(s)                 OT Problem List: Decreased strength;Decreased activity tolerance;Impaired balance (sitting and/or standing);Decreased cognition;Decreased safety awareness   OT Treatment/Interventions: Self-care/ADL training;DME and/or AE  instruction;Patient/family education;Cognitive remediation/compensation;Therapeutic activities    OT Goals(Current goals can be found in the care plan section) Acute Rehab OT Goals Patient Stated Goal: home OT Goal Formulation: With patient Time For Goal Achievement: 08/20/16 Potential to Achieve Goals: Good ADL Goals Pt Will Transfer to Toilet: with supervision;bedside commode;ambulating Pt Will Perform Toileting - Clothing Manipulation and hygiene: with supervision;sit to/from stand Additional ADL Goal #1: pt will complete adls at supervision level with min cues for thoroughness/sequence  OT Frequency: Min 2X/week   Barriers to D/C:            Co-evaluation              End of Session    Activity Tolerance: Patient tolerated treatment well Patient left: in bed;with call bell/phone within reach;with bed alarm set   Time: 1042-1110 OT Time Calculation (min): 28 min Charges:  OT General Charges $OT Visit: 1 Procedure OT Evaluation $OT Eval Low Complexity: 1 Procedure OT Treatments $Self Care/Home Management : 8-22 mins G-Codes:    Hilliary Jock 08/14/2016, 11:28 AM Marica Otter, OTR/L [phone] 08/14/16

## 2016-08-13 NOTE — Progress Notes (Signed)
LCSW received call from MD that patient is medically stable to [facility].  PT/OT to follow up prior to DC. If patient needs home health, this can be done at facility and MD was made aware and just need order.  LCSW notified CM of plan and need for PT.  Deretha EmoryHannah Dray Dente LCSW, MSW Clinical Social Work: System [employer] Coverage for :  Tresa EndoKelly, [phone]

## 2016-08-19 ENCOUNTER — Ambulatory Visit (INDEPENDENT_AMBULATORY_CARE_PROVIDER_SITE_OTHER): Payer: Medicare Other | Admitting: Internal Medicine

## 2016-08-19 ENCOUNTER — Encounter: Payer: Self-pay | Admitting: Internal Medicine

## 2016-08-19 VITALS — BP 148/78 | HR 67 | Temp 98.5°F | Wt 163.0 lb

## 2016-08-19 DIAGNOSIS — K920 Hematemesis: Secondary | ICD-10-CM | POA: Diagnosis not present

## 2016-08-19 DIAGNOSIS — D62 Acute posthemorrhagic anemia: Secondary | ICD-10-CM | POA: Diagnosis not present

## 2016-08-19 DIAGNOSIS — F0280 Dementia in other diseases classified elsewhere without behavioral disturbance: Secondary | ICD-10-CM | POA: Diagnosis not present

## 2016-08-19 DIAGNOSIS — G301 Alzheimer's disease with late onset: Secondary | ICD-10-CM | POA: Diagnosis not present

## 2016-08-19 NOTE — Progress Notes (Signed)
Pre visit review using our clinic review tool, if applicable. No additional management support is needed unless otherwise documented below in the visit note. 

## 2016-08-19 NOTE — Patient Instructions (Signed)
We are checking the blood work today to make sure you are not still bleeding.   It takes about 1 week to recover from 1 day in the hospital so it may take you 1-2 months to be back to normal.   It is okay to start taking the memory medicine aricept (donepezil).

## 2016-08-20 ENCOUNTER — Encounter: Payer: Self-pay | Admitting: Internal Medicine

## 2016-08-20 ENCOUNTER — Inpatient Hospital Stay: Payer: Medicare Other | Admitting: Internal Medicine

## 2016-08-20 NOTE — Assessment & Plan Note (Signed)
She denies any vomiting or blood loss since hospital stay. She is on PPI. Checking CBC today and adjust as needed. She is not on an oral iron supplement per discharge records and she does not know her medications.

## 2016-08-20 NOTE — Assessment & Plan Note (Signed)
Since there was no one with her today it is difficult to assess her true situation. She is not in pain on exam today and is not able to know her medications.

## 2016-08-20 NOTE — Assessment & Plan Note (Signed)
She is supposed to be taking protonix daily and she is not sure if she is or not. Denies further blood loss since the hospital. Checking CBC and adjust as needed. She did have an ulcer on EGD.

## 2016-08-20 NOTE — Progress Notes (Signed)
   Subjective:    Patient ID: Carol Santiago, female    DOB: 15-Oct-1925, 82 y.o.   MRN: [id number]  HPI The patient is an 82 YO female coming in for hospital follow up (in for acute GI bleed with vomiting blood and EGD with ulcer found, started on PPI therapy with transition to oral care, blood counts improving gradually by discharge). She is home now and still having some mild stomach discomfort. She is eating okay at home. She thinks she is taking the stomach medicine but she does not know her medications and has dementia. She is present at visit by herself. She denies falls or chest pains or SOB. She denies any blood in her stool or vomiting. She does live at [facility] but could not recall the name of the place that she lives.   [facility], social [facility] reviewed and updated.   Review of Systems  Constitutional: Positive for activity change. Negative for appetite change, chills, diaphoresis, fatigue, fever and unexpected weight change.  HENT: Negative.   Eyes: Negative.   Respiratory: Negative.   Cardiovascular: Negative.   Gastrointestinal: Positive for abdominal pain. Negative for abdominal distention, blood in stool, constipation, diarrhea, nausea and vomiting.  Musculoskeletal: Negative.   Skin: Negative.   Neurological: Negative for dizziness, syncope and weakness.       Memory change  Psychiatric/Behavioral: Negative.       Objective:   Physical Exam  Constitutional: She appears well-developed and well-nourished.  HENT:  Head: Normocephalic and atraumatic.  Eyes: EOM are normal.  Neck: Normal range of motion.  Cardiovascular: Normal rate and regular rhythm.   Pulmonary/Chest: Effort normal and breath sounds normal.  Abdominal: Soft. Bowel sounds are normal. She exhibits no distension. There is no tenderness. There is no rebound and no guarding.  Musculoskeletal: She exhibits no edema.  Neurological: She is alert. Coordination normal.  Slow to rise with slow gait    Skin: Skin is warm and dry.  Psychiatric: She has a normal mood and affect.   Vitals:   08/19/16 1402 08/19/16 1440  BP: (!) 162/82 (!) 148/78  Pulse: 67   Temp: 98.5 F (36.9 C)   TempSrc: Oral   SpO2: 97%   Weight: 163 lb (73.9 kg)       Assessment & Plan:

## 2016-10-25 ENCOUNTER — Ambulatory Visit (INDEPENDENT_AMBULATORY_CARE_PROVIDER_SITE_OTHER): Payer: Medicare Other | Admitting: Internal Medicine

## 2016-10-25 ENCOUNTER — Encounter: Payer: Self-pay | Admitting: Internal Medicine

## 2016-10-25 DIAGNOSIS — J209 Acute bronchitis, unspecified: Secondary | ICD-10-CM | POA: Insufficient documentation

## 2016-10-25 MED ORDER — DOXYCYCLINE HYCLATE 100 MG PO TABS: 100.0000 mg | ORAL_TABLET | Freq: Two times a day (BID) | ORAL | 0 refills | Status: DC

## 2016-10-25 NOTE — Progress Notes (Signed)
Pre visit review using our clinic review tool, if applicable. No additional management support is needed unless otherwise documented below in the visit note. 

## 2016-10-25 NOTE — Patient Instructions (Signed)
We have sent in doxycycline for the cough. Take 1 pill twice a day for 1 week. I would recommend to take it with food.   It is okay to crush the pills if you need to.   Call us back if you are not feeling better.

## 2016-10-25 NOTE — Assessment & Plan Note (Signed)
Doxycycline prescribed for the congestion in the lungs on exam. Return if no improvement. She declines cough syrup.

## 2016-10-25 NOTE — Progress Notes (Signed)
   Subjective:    Patient ID: Carol Santiago, Carol Santiago    DOB: [birth year], 82 y.o.   MRN: [id number]  HPI The patient is an 82 YO Carol Santiago coming in for cough and congestion for 3 weeks now. She denies fever but having some chills. The cough is non-productive and she denies SOB. The cough is most of the day and slightly worse at night. Some mild nose congestion but overall this is improving since the beginning. She has tried some over the counter medicines which were not effective.   Review of Systems  Constitutional: Positive for activity change and chills. Negative for appetite change, fatigue, fever and unexpected weight change.  HENT: Positive for congestion, postnasal drip and rhinorrhea. Negative for ear discharge, ear pain, sinus pain, sinus pressure, sore throat and trouble swallowing.   Eyes: Negative.   Respiratory: Positive for cough. Negative for chest tightness, shortness of breath and wheezing.   Cardiovascular: Negative.   Gastrointestinal: Negative.       Objective:   Physical Exam  Constitutional: She appears well-developed and well-nourished.  HENT:  Head: Normocephalic and atraumatic.  Oropharynx with mild erythema, bilateral TM normal.   Eyes: EOM are normal.  Neck: Normal range of motion.  Cardiovascular: Normal rate and regular rhythm.   Pulmonary/Chest: Effort normal. No respiratory distress. She has no wheezes. She has no rales. She exhibits no tenderness.  Some scattered rhonchi which partially clear with cough.  Abdominal: Soft.  Lymphadenopathy:    She has no cervical adenopathy.  Neurological: She is alert.  Skin: Skin is warm and dry.   Vitals:   10/25/16 1349  BP: 124/62  Pulse: 70  Resp: 12  Temp: 97.9 F (36.6 C)  TempSrc: Oral  SpO2: 98%  Weight: 154 lb (69.9 kg)  Height: 5\' 6"  (1.676 m)      Assessment & Plan:

## 2016-10-27 ENCOUNTER — Telehealth: Payer: Self-pay | Admitting: Emergency Medicine

## 2016-10-27 NOTE — Telephone Encounter (Signed)
Patient's son called and said that the patient has had weight loss and is not eating. She is scared to eat because she is scared of what her bowels will do. She says she cannot control them. He set up her last appt on Monday when she came in for a cold. He wanted us to address her bowels, but his mother only told us about a cold. His mother may have to come in for a bowel problem.

## 2016-10-27 NOTE — Telephone Encounter (Signed)
Pts son called and stated his mom was in to be seen on Monday but everything that needed to be discussed was not discussed. He asked that you give him a call so he can go over this with you. Pt feels like her medicine is messing with her bowels. Please advise thanks.

## 2016-11-02 ENCOUNTER — Other Ambulatory Visit: Payer: Self-pay | Admitting: Internal Medicine

## 2016-11-08 ENCOUNTER — Encounter: Payer: Self-pay | Admitting: Internal Medicine

## 2016-11-12 ENCOUNTER — Other Ambulatory Visit (INDEPENDENT_AMBULATORY_CARE_PROVIDER_SITE_OTHER): Payer: Medicare Other

## 2016-11-12 ENCOUNTER — Ambulatory Visit: Payer: Self-pay | Admitting: Internal Medicine

## 2016-11-12 ENCOUNTER — Encounter: Payer: Self-pay | Admitting: Internal Medicine

## 2016-11-12 ENCOUNTER — Ambulatory Visit (INDEPENDENT_AMBULATORY_CARE_PROVIDER_SITE_OTHER): Payer: Medicare Other | Admitting: Internal Medicine

## 2016-11-12 VITALS — BP 130/70 | HR 63 | Temp 97.8°F | Resp 12 | Ht 66.0 in | Wt 149.0 lb

## 2016-11-12 DIAGNOSIS — R197 Diarrhea, unspecified: Secondary | ICD-10-CM

## 2016-11-12 DIAGNOSIS — A09 Infectious gastroenteritis and colitis, unspecified: Secondary | ICD-10-CM | POA: Diagnosis not present

## 2016-11-12 LAB — CBC
HCT: 35.9 % — ABNORMAL LOW (ref 36.0–46.0)
HEMOGLOBIN: 12.5 g/dL (ref 12.0–15.0)
MCHC: 34.8 g/dL (ref 30.0–36.0)
MCV: 96.2 fl (ref 78.0–100.0)
PLATELETS: 213 10*3/uL (ref 150.0–400.0)
RBC: 3.73 Mil/uL — ABNORMAL LOW (ref 3.87–5.11)
RDW: 14.9 % (ref 11.5–15.5)
WBC: 6.9 10*3/uL (ref 4.0–10.5)

## 2016-11-12 LAB — COMPREHENSIVE METABOLIC PANEL
ALBUMIN: 3.9 g/dL (ref 3.5–5.2)
ALK PHOS: 48 U/L (ref 39–117)
ALT: 16 U/L (ref 0–35)
AST: 23 U/L (ref 0–37)
BUN: 35 mg/dL — AB (ref 6–23)
CO2: 29 mEq/L (ref 19–32)
Calcium: 9.1 mg/dL (ref 8.4–10.5)
Chloride: 103 mEq/L (ref 96–112)
Creatinine, Ser: 0.86 mg/dL (ref 0.40–1.20)
GFR: 65.93 mL/min (ref >60.00)
Glucose, Bld: 130 mg/dL — ABNORMAL HIGH (ref 70–99)
POTASSIUM: 4.4 meq/L (ref 3.5–5.1)
Sodium: 139 mEq/L (ref 135–145)
TOTAL PROTEIN: 6.5 g/dL (ref 6.0–8.3)
Total Bilirubin: 1.8 mg/dL — ABNORMAL HIGH (ref 0.2–1.2)

## 2016-11-12 LAB — LIPASE: Lipase: 33 U/L (ref 11.0–59.0)

## 2016-11-12 MED ORDER — ONDANSETRON 4 MG PO TBDP: 4.0000 mg | ORAL_TABLET | Freq: Three times a day (TID) | ORAL | 0 refills | Status: DC | PRN

## 2016-11-12 NOTE — Progress Notes (Signed)
   Subjective:    Patient ID: Carol Santiago, female    DOB: March 14, 1925, 82 y.o.   MRN: [id number]  HPI The patient is an 82 YO female coming in for nausea and diarrhea. She does have dementia and is not able to recall all the timeline. She does not have any family with her to help with history. She thinks the symptoms started several days ago. Some nausea and vomiting with some loose stools. She does not know how many times per day. She is still drinking some liquids but not eating well. No stomach pain. No fevers or chills. No blood in the diarrhea. She is thinking that her [facility] has a lot of people with similar symptoms. She cannot say if the symptoms are getting better or worse.   Review of Systems  Unable to perform ROS: Dementia  Constitutional: Positive for appetite change and fatigue. Negative for activity change, chills, fever and unexpected weight change.  Respiratory: Negative.   Cardiovascular: Negative.   Gastrointestinal: Positive for diarrhea, nausea and vomiting. Negative for abdominal distention and abdominal pain.      Objective:   Physical Exam  Constitutional: She appears well-developed and well-nourished.  HENT:  Head: Normocephalic and atraumatic.  Eyes: EOM are normal.  Cardiovascular: Normal rate and regular rhythm.   Pulmonary/Chest: Effort normal and breath sounds normal. No respiratory distress. She has no wheezes. She has no rales.  Abdominal: Soft. Bowel sounds are normal. She exhibits distension. She exhibits no mass. There is no tenderness. There is no rebound and no guarding.  Slightly active BS, no tenderness to palpation. Some distention versus adiposity.   Neurological: She is alert.  Skin: Skin is warm and dry.   Vitals:   11/12/16 1112  BP: 130/70  Pulse: 63  Resp: 12  Temp: 97.8 F (36.6 C)  TempSrc: Oral  SpO2: 99%  Weight: 149 lb (67.6 kg)  Height: 5\' 6"  (1.676 m)      Assessment & Plan:

## 2016-11-12 NOTE — Progress Notes (Signed)
Pre visit review using our clinic review tool, if applicable. No additional management support is needed unless otherwise documented below in the visit note. 

## 2016-11-12 NOTE — Patient Instructions (Addendum)
We will have you stop taking metformin for 2 weeks or until the stomach symptoms are all gone.   We need to check the blood work today to make sure it is okay.   We have sent in zofran to your pharmacy which you can take 1 pill every 4 hours as needed for nausea.   Make sure to drink extra fluids such as gatorade or pedialyte that have some sugar and electrolytes in them to help.   We have listed some foods that are easier on your stomach to help with the diarrhea symptoms. It is okay to take imodium for the diarrhea if you need to.   Food Choices to Help Relieve Diarrhea, Adult When you have diarrhea, the foods you eat and your eating habits are very important. Choosing the right foods and drinks can help relieve diarrhea. Also, because diarrhea can last up to 7 days, you need to replace lost fluids and electrolytes (such as sodium, potassium, and chloride) in order to help prevent dehydration. What general guidelines do I need to follow?  Slowly drink 1 cup (8 oz) of fluid for each episode of diarrhea. If you are getting enough fluid, your urine will be clear or pale yellow.  Eat starchy foods. Some good choices include white rice, white toast, pasta, low-fiber cereal, baked potatoes (without the skin), saltine crackers, and bagels.  Avoid large servings of any cooked vegetables.  Limit fruit to two servings per day. A serving is  cup or 1 small piece.  Choose foods with less than 2 g of fiber per serving.  Limit fats to less than 8 tsp (38 g) per day.  Avoid fried foods.  Eat foods that have probiotics in them. Probiotics can be found in certain dairy products.  Avoid foods and beverages that may increase the speed at which food moves through the stomach and intestines (gastrointestinal tract). Things to avoid include:  High-fiber foods, such as dried fruit, raw fruits and vegetables, nuts, seeds, and whole grain foods.  Spicy foods and high-fat foods.  Foods and beverages  sweetened with high-fructose corn syrup, honey, or sugar alcohols such as xylitol, sorbitol, and mannitol. What foods are recommended? Grains  White rice. White, [place]French, or pita breads (fresh or toasted), including plain rolls, buns, or bagels. White pasta. Saltine, soda, or graham crackers. Pretzels. Low-fiber cereal. Cooked cereals made with water (such as cornmeal, farina, or cream cereals). Plain muffins. Matzo. Melba toast. Zwieback. Vegetables  Potatoes (without the skin). Strained tomato and vegetable juices. Most well-cooked and canned vegetables without seeds. Tender lettuce. Fruits  Cooked or canned applesauce, apricots, cherries, fruit cocktail, grapefruit, peaches, pears, or plums. Fresh bananas, apples without skin, cherries, grapes, cantaloupe, grapefruit, peaches, oranges, or plums. Meat and Other Protein Products  Baked or boiled chicken. Eggs. Tofu. Fish. Seafood. Smooth peanut butter. Ground or well-cooked tender beef, ham, veal, lamb, pork, or poultry. Dairy  Plain yogurt, kefir, and unsweetened liquid yogurt. Lactose-free milk, buttermilk, or soy milk. Plain hard cheese. Beverages  Sport drinks. Clear broths. Diluted fruit juices (except prune). Regular, caffeine-free sodas such as ginger ale. Water. Decaffeinated teas. Oral rehydration solutions. Sugar-free beverages not sweetened with sugar alcohols. Other  Bouillon, broth, or soups made from recommended foods. The items listed above may not be a complete list of recommended foods or beverages. Contact your dietitian for more options.  What foods are not recommended? Grains  Whole grain, whole wheat, bran, or rye breads, rolls, pastas, crackers, and cereals. Wild  or brown rice. Cereals that contain more than 2 g of fiber per serving. Corn tortillas or taco shells. Cooked or dry oatmeal. Granola. Popcorn. Vegetables  Raw vegetables. Cabbage, broccoli, Brussels sprouts, artichokes, baked beans, beet greens, corn, kale,  legumes, peas, sweet potatoes, and yams. Potato skins. Cooked spinach and cabbage. Fruits  Dried fruit, including raisins and dates. Raw fruits. Stewed or dried prunes. Fresh apples with skin, apricots, mangoes, pears, raspberries, and strawberries. Meat and Other Protein Products  Chunky peanut butter. Nuts and seeds. Beans and lentils. Tomasa BlaseBacon. Dairy  High-fat cheeses. Milk, chocolate milk, and beverages made with milk, such as milk shakes. Cream. Ice cream. Sweets and Desserts  Sweet rolls, doughnuts, and sweet breads. Pancakes and waffles. Fats and Oils  Butter. Cream sauces. Margarine. Salad oils. Plain salad dressings. Olives. Avocados. Beverages  Caffeinated beverages (such as coffee, tea, soda, or energy drinks). Alcoholic beverages. Fruit juices with pulp. Prune juice. Soft drinks sweetened with high-fructose corn syrup or sugar alcohols. Other  Coconut. Hot sauce. Chili powder. Mayonnaise. Gravy. Cream-based or milk-based soups. The items listed above may not be a complete list of foods and beverages to avoid. Contact your dietitian for more information.  What should I do if I become dehydrated? Diarrhea can sometimes lead to dehydration. Signs of dehydration include dark urine and dry mouth and skin. If you think you are dehydrated, you should rehydrate with an oral rehydration solution. These solutions can be purchased at pharmacies, retail stores, or online. Drink -1 cup (120-240 mL) of oral rehydration solution each time you have an episode of diarrhea. If drinking this amount makes your diarrhea worse, try drinking smaller amounts more often. For example, drink 1-3 tsp (5-15 mL) every 5-10 minutes. A general rule for staying hydrated is to drink 1-2 L of fluid per day. Talk to your health care provider about the specific amount you should be drinking each day. Drink enough fluids to keep your urine clear or pale yellow. This information is not intended to replace advice given to  you by your health care provider. Make sure you discuss any questions you have with your health care provider. Document Released: 01/22/2004 Document Revised: 04/08/2016 Document Reviewed: 09/24/2013 Elsevier Interactive Patient Education  2017 [employer]

## 2016-11-12 NOTE — Assessment & Plan Note (Signed)
With many others at her living facility with similar symptoms is likely viral illness. Given recent doxycycline treatment if not resolving need C dif testing. Checking CMP and CBC today for any signs of dehydration. Rx for zofran and encouraged liquids and brat diet.

## 2016-11-18 ENCOUNTER — Other Ambulatory Visit: Payer: Self-pay | Admitting: Internal Medicine

## 2016-11-24 ENCOUNTER — Encounter: Payer: Self-pay | Admitting: Internal Medicine

## 2016-11-24 MED ORDER — LEVOTHYROXINE SODIUM 75 MCG PO TABS: 75.0000 ug | ORAL_TABLET | Freq: Every day | ORAL | 5 refills | Status: DC

## 2017-01-24 ENCOUNTER — Ambulatory Visit: Payer: Self-pay | Admitting: Internal Medicine

## 2017-01-26 ENCOUNTER — Ambulatory Visit (INDEPENDENT_AMBULATORY_CARE_PROVIDER_SITE_OTHER): Payer: Medicare Other | Admitting: Internal Medicine

## 2017-01-26 ENCOUNTER — Encounter: Payer: Self-pay | Admitting: Internal Medicine

## 2017-01-26 DIAGNOSIS — J301 Allergic rhinitis due to pollen: Secondary | ICD-10-CM

## 2017-01-26 MED ORDER — OLOPATADINE HCL 0.1 % OP SOLN: 1.0000 [drp] | Freq: Two times a day (BID) | OPHTHALMIC | 12 refills | Status: DC

## 2017-01-26 NOTE — Progress Notes (Signed)
Pre visit review using our clinic review tool, if applicable. No additional management support is needed unless otherwise documented below in the visit note. 

## 2017-01-26 NOTE — Patient Instructions (Addendum)
We want you to make the visit with the eye doctor, Dr. Dione BoozeGroat and his number is [phone].  We have sent in some eye drops which should help with the left eye.

## 2017-01-27 ENCOUNTER — Telehealth: Payer: Self-pay | Admitting: Internal Medicine

## 2017-01-27 DIAGNOSIS — J309 Allergic rhinitis, unspecified: Secondary | ICD-10-CM | POA: Insufficient documentation

## 2017-01-27 NOTE — Telephone Encounter (Signed)
The pts son called about an appointment that Dr Okey Duprerawford requested for Monday 01/31/17 for a Dexa Scan Opthalmology Exam. He wanted to know the reason for this appointment. He does not live here so he is having to [facility]. Please advise. Thanks [employer]

## 2017-01-27 NOTE — Progress Notes (Signed)
   Subjective:    Patient ID: Carol DunkerMarjorie Santiago, female    DOB: 23-Apr-1925, 83 y.o.   MRN: [id number]  HPI The patient is an 83 YO female coming in for eye itching for about 1-2 weeks. She is having some leaking fluid and is scratching her eye area. After she started this it started getting some mild rash and swelling. She denies fevers or chills. No significant crusting from the eye. Has some mild sinus symptoms at this time. Has not tried anything for it. She has not seen the eye doctor in several years. (possibly 2014 from our records).   Review of Systems  Constitutional: Negative.   HENT: Positive for congestion and rhinorrhea. Negative for dental problem, drooling, ear discharge, ear pain, postnasal drip, sinus pain, sinus pressure, sneezing, sore throat, tinnitus and trouble swallowing.   Eyes: Positive for itching. Negative for photophobia, pain, discharge, redness and visual disturbance.  Respiratory: Negative.   Cardiovascular: Negative.   Gastrointestinal: Negative.   Musculoskeletal: Negative.       Objective:   Physical Exam  Constitutional: She appears well-developed and well-nourished.  HENT:  Head: Normocephalic and atraumatic.  Right Ear: External ear normal.  Left Ear: External ear normal.  Mouth/Throat: Oropharynx is clear and moist.  Eyes: Conjunctivae and EOM are normal. Pupils are equal, round, and reactive to light.  Left eyelid with redness and mild swelling, no discharge except mild watery discharge. Pupils and conjunctivae normal.   Neck: Normal range of motion. No JVD present.  Cardiovascular: Normal rate and regular rhythm.   Pulmonary/Chest: Effort normal and breath sounds normal.  Abdominal: Soft.  Musculoskeletal: She exhibits no edema.  Lymphadenopathy:    She has no cervical adenopathy.  Skin: Skin is warm and dry.   Vitals:   01/26/17 1102  BP: 128/72  Pulse: 64  Temp: 97.5 F (36.4 C)  TempSrc: Oral  SpO2: 99%  Weight: 147 lb (66.7 kg)    Height: 5\' 6"  (1.676 m)      Assessment & Plan:

## 2017-01-27 NOTE — Assessment & Plan Note (Signed)
Symptoms correlate with onset of pollen and will treat with patanol eye drops for likely allergic etiology. No redness in the conjunctivae or infection. Eye tracking and pupils normal. Some redness and puffiness in the eyelids and asked her not to scratch. Also have asked her to get eyes checked as it has been some time.

## 2017-01-27 NOTE — Telephone Encounter (Signed)
Son contacted and stated awareness

## 2017-01-27 NOTE — Telephone Encounter (Signed)
She is having some blurry vision so I requested that she see her eye specialist as she has not been in some years. I did not request dexa at her visit.

## 2017-01-31 ENCOUNTER — Ambulatory Visit: Payer: Medicare Other | Admitting: Internal Medicine

## 2017-02-07 ENCOUNTER — Encounter: Payer: Self-pay | Admitting: Internal Medicine

## 2017-02-07 ENCOUNTER — Ambulatory Visit (INDEPENDENT_AMBULATORY_CARE_PROVIDER_SITE_OTHER): Payer: Medicare Other | Admitting: Internal Medicine

## 2017-02-07 VITALS — BP 130/70 | HR 58 | Temp 97.9°F | Resp 12 | Ht 66.0 in | Wt 146.0 lb

## 2017-02-07 DIAGNOSIS — I872 Venous insufficiency (chronic) (peripheral): Secondary | ICD-10-CM | POA: Diagnosis not present

## 2017-02-07 DIAGNOSIS — F0280 Dementia in other diseases classified elsewhere without behavioral disturbance: Secondary | ICD-10-CM

## 2017-02-07 DIAGNOSIS — G301 Alzheimer's disease with late onset: Secondary | ICD-10-CM

## 2017-02-07 DIAGNOSIS — H00014 Hordeolum externum left upper eyelid: Secondary | ICD-10-CM

## 2017-02-07 DIAGNOSIS — M7989 Other specified soft tissue disorders: Secondary | ICD-10-CM | POA: Diagnosis not present

## 2017-02-07 MED ORDER — CLINDAMYCIN HCL 300 MG PO CAPS: 300.0000 mg | ORAL_CAPSULE | Freq: Three times a day (TID) | ORAL | 0 refills | Status: DC

## 2017-02-07 NOTE — Patient Instructions (Addendum)
We have sent in an antibiotic called clindamycin to help clear the infection. Take it 1 pill 3 times per day for 5 days to help the eye.   We have given you a prescription for the socks to help with the swelling in the legs.   There is another medicine for memory that we talked about today. If you decide you want to try it let us know.

## 2017-02-07 NOTE — Progress Notes (Signed)
   Subjective:    Patient ID: Carol Santiago, female    DOB: 06-02-1925, 83 y.o.   MRN: [id number]  HPI The patient is an 83 YO female coming in for follow up of her eye redness. She is not sure if she used the drops we prescribed last time. She is having decrease overall in the redness. She does have a new bump on her eyelid which was not present 1 week ago. She is not sure exactly when it started. No fevers or chills. Sinuses are fairly clear now and no pressure.  Her son is in the [employer] and has several concerns. He is not able to be present and are not able to address many of those concerns due to the acuity of the visit. He is concerned about worsening memory (patient denies these concerns), feet swelling (patient is not sure when this started and does not recall it worsening lately), and several others which we do not get to at all.   Review of Systems  Constitutional: Negative for activity change, appetite change, fatigue, fever and unexpected weight change.  HENT: Negative.   Eyes: Positive for pain, redness and visual disturbance. Negative for photophobia, discharge and itching.  Respiratory: Negative.   Cardiovascular: Negative.   Gastrointestinal: Negative.   Neurological:       Memory change      Objective:   Physical Exam  Constitutional: She appears well-developed and well-nourished.  HENT:  Head: Normocephalic and atraumatic.  Eyes: EOM are normal.  Stye on the left upper eyelid with surrounding cellulitis and abscess.   Neck: Normal range of motion.  Cardiovascular: Normal rate and regular rhythm.   Pulmonary/Chest: Effort normal and breath sounds normal.  Abdominal: Soft.  Neurological: She is alert. Coordination normal.  Skin: Skin is warm and dry.   Vitals:   02/07/17 1347  BP: 130/70  Pulse: (!) 58  Resp: 12  Temp: 97.9 F (36.6 C)  TempSrc: Oral  SpO2: 98%  Weight: 146 lb (66.2 kg)  Height: 5\' 6"  (1.676 m)      Assessment & Plan:

## 2017-02-07 NOTE — Progress Notes (Signed)
Pre visit review using our clinic review tool, if applicable. No additional management support is needed unless otherwise documented below in the visit note. 

## 2017-02-08 DIAGNOSIS — M7989 Other specified soft tissue disorders: Secondary | ICD-10-CM | POA: Insufficient documentation

## 2017-02-08 DIAGNOSIS — H00019 Hordeolum externum unspecified eye, unspecified eyelid: Secondary | ICD-10-CM | POA: Insufficient documentation

## 2017-02-08 NOTE — Assessment & Plan Note (Signed)
rx for compression stockings to help with mild venous insufficiency. She denies recent change or concerns about her leg swelling.

## 2017-02-08 NOTE — Assessment & Plan Note (Signed)
It is unclear if there is worsening to her dementia as no family is present to talk to about her condition. She is not admitting that there are any changes. She does also think that she drove herself here although she has a driver and would not be safe for driving at this time due to memory. It is difficult to say how many of her ADLs she is capable of performing or if there are any behavioral changes from the dementia. Offered additional memory medication which she declined.

## 2017-02-08 NOTE — Assessment & Plan Note (Signed)
This is complicated by some cellulitis on the eye lid and abscess. Rx for clindamycin 1 week to clear. She will continue with (if she has used) the eye drops which seem to have helped the allergic component of the redness from last visit to now.

## 2017-03-23 LAB — HM DIABETES EYE EXAM

## 2017-03-25 ENCOUNTER — Encounter: Payer: Self-pay | Admitting: Internal Medicine

## 2017-03-25 NOTE — Progress Notes (Unsigned)
Results entered and sent to scan  

## 2017-05-23 ENCOUNTER — Other Ambulatory Visit: Payer: Self-pay | Admitting: Internal Medicine

## 2017-08-02 ENCOUNTER — Ambulatory Visit (INDEPENDENT_AMBULATORY_CARE_PROVIDER_SITE_OTHER): Payer: Medicare Other | Admitting: Internal Medicine

## 2017-08-02 ENCOUNTER — Encounter: Payer: Self-pay | Admitting: Internal Medicine

## 2017-08-02 VITALS — BP 138/68 | HR 59 | Temp 98.1°F | Ht 66.0 in | Wt 143.0 lb

## 2017-08-02 DIAGNOSIS — G308 Other Alzheimer's disease: Secondary | ICD-10-CM | POA: Diagnosis not present

## 2017-08-02 DIAGNOSIS — F0280 Dementia in other diseases classified elsewhere without behavioral disturbance: Secondary | ICD-10-CM | POA: Diagnosis not present

## 2017-08-02 DIAGNOSIS — R296 Repeated falls: Secondary | ICD-10-CM

## 2017-08-02 NOTE — Progress Notes (Signed)
   Subjective:    Patient ID: Carol Santiago, female    DOB: [birth year], 83 y.o.   MRN: [id number]  HPI The patient is a 83 YO female coming in for recurrent falls at home. She is living in the independent living at [facility] and she is walking down to the cafeteria by herself. She is not always able to make it. She is falling in the apartment and sometimes it is hours before someone can check on her. She denies hitting her head or LOC (by has dementia). History provided by her son. She does not admit to any problems. Her son has noticed that she is very weak when walking and is concerned that she is no longer safe while using her walker.   Review of Systems  Unable to perform ROS: Dementia  Constitutional: Positive for activity change and fatigue. Negative for appetite change, chills, fever and unexpected weight change.  Respiratory: Negative.   Cardiovascular: Negative.   Gastrointestinal: Negative.   Musculoskeletal: Positive for gait problem.  Neurological:       Memory pain      Objective:   Physical Exam  Constitutional: She appears well-developed and well-nourished.  Smelling of urine  HENT:  Head: Normocephalic and atraumatic.  Eyes: EOM are normal.  Neck: Normal range of motion.  Cardiovascular: Normal rate and regular rhythm.   Pulmonary/Chest: Effort normal and breath sounds normal. No respiratory distress. She has no wheezes. She has no rales.  Abdominal: Soft. Bowel sounds are normal. She exhibits no distension. There is no tenderness. There is no rebound.  Neurological: She is alert.  Oriented x1, coordination is abnormal and slow, using wheeled walker  Skin: Skin is warm and dry.   Vitals:   08/02/17 1438  BP: 138/68  Pulse: (!) 59  Temp: 98.1 F (36.7 C)  TempSrc: Oral  SpO2: 98%  Weight: 143 lb (64.9 kg)  Height:  (1.676 m)      Assessment & Plan:

## 2017-08-02 NOTE — Patient Instructions (Signed)
We have put in the orders for the home health to see what the insurance will cover.   As we talked about most insurance companies will not pay for a home health aide (they do housework, sitting with patient, bathing). These would have to be privately paid for.   It is okay to stop metformin as this can cause diarrhea to see if the accidents decrease.

## 2017-08-03 DIAGNOSIS — R296 Repeated falls: Secondary | ICD-10-CM | POA: Insufficient documentation

## 2017-08-03 NOTE — Assessment & Plan Note (Signed)
Needs home health to help with PT/OT and nursing to help teach her some home safety in the house. Discussed with her son that insurance does not typically cover health aide but will cover home health.

## 2017-08-03 NOTE — Assessment & Plan Note (Signed)
She is severely deconditioned on exam and requires PT/OT to help maximize her safety in independent living. If she is not able to improve her strength she may need to move to AL part of the facility. She is currently able to walk to cafeteria and no weight loss but having more falls although without serious injury at this time.

## 2017-08-11 IMAGING — DX DG CHEST 1V PORT
1 series · 1 of 1 positions shown · non-contrast
Comparison: Chest radiograph dated 11/15/2013

CLINICAL DATA: 89-year-old female with chest pain and hematemesis.

EXAM:
PORTABLE CHEST 1 VIEW

[chest ap]
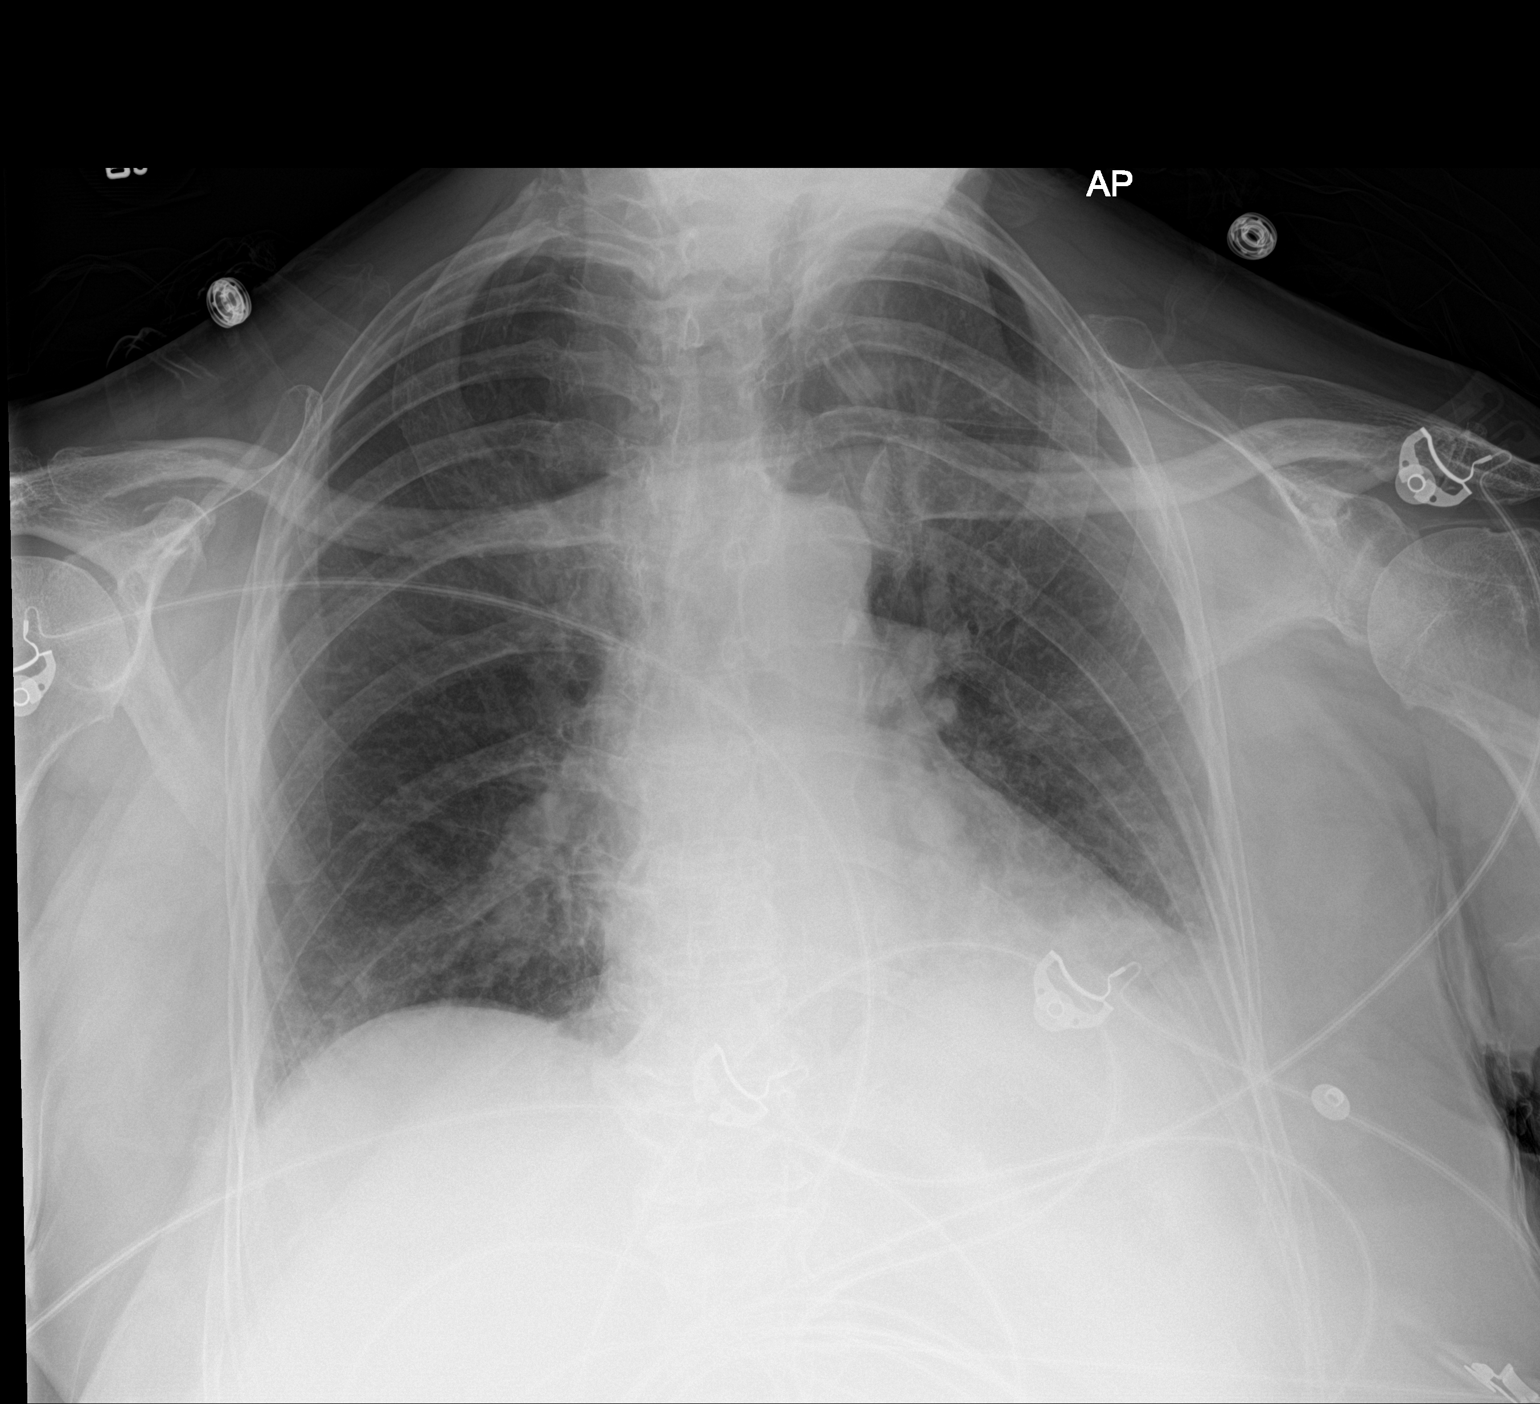

[1 of 1 positions shown; findings below may reference images not displayed]

FINDINGS: Single portable view of the chest demonstrates mild cardiomegaly
with central vascular prominence compatible with mild congestive
changes. A small left pleural effusion may be present. Left lung
base airspace density may represent atelectatic changes versus
infiltrate. Clinical correlation is recommended. There is no
pneumothorax. No acute osseous pathology identified.
IMPRESSION: Cardiomegaly with mild congestive changes.

Probable small left pleural effusion and left lung base atelectasis
versus infiltrate. Clinical correlation is recommended.

## 2017-08-18 ENCOUNTER — Other Ambulatory Visit: Payer: Self-pay | Admitting: Internal Medicine

## 2017-08-30 DIAGNOSIS — E039 Hypothyroidism, unspecified: Secondary | ICD-10-CM | POA: Diagnosis not present

## 2017-08-30 DIAGNOSIS — Z7984 Long term (current) use of oral hypoglycemic drugs: Secondary | ICD-10-CM | POA: Diagnosis not present

## 2017-08-30 DIAGNOSIS — J309 Allergic rhinitis, unspecified: Secondary | ICD-10-CM | POA: Diagnosis not present

## 2017-08-30 DIAGNOSIS — Z9181 History of falling: Secondary | ICD-10-CM | POA: Diagnosis not present

## 2017-08-30 DIAGNOSIS — F0280 Dementia in other diseases classified elsewhere without behavioral disturbance: Secondary | ICD-10-CM | POA: Diagnosis not present

## 2017-08-30 DIAGNOSIS — G308 Other Alzheimer's disease: Secondary | ICD-10-CM | POA: Diagnosis not present

## 2017-08-30 DIAGNOSIS — E119 Type 2 diabetes mellitus without complications: Secondary | ICD-10-CM | POA: Diagnosis not present

## 2017-08-30 DIAGNOSIS — Z8673 Personal history of transient ischemic attack (TIA), and cerebral infarction without residual deficits: Secondary | ICD-10-CM | POA: Diagnosis not present

## 2017-08-30 DIAGNOSIS — H9190 Unspecified hearing loss, unspecified ear: Secondary | ICD-10-CM | POA: Diagnosis not present

## 2017-08-30 DIAGNOSIS — I10 Essential (primary) hypertension: Secondary | ICD-10-CM | POA: Diagnosis not present

## 2017-08-30 DIAGNOSIS — Z7902 Long term (current) use of antithrombotics/antiplatelets: Secondary | ICD-10-CM | POA: Diagnosis not present

## 2017-09-06 ENCOUNTER — Emergency Department (HOSPITAL_COMMUNITY)
Admission: EM | Admit: 2017-09-06 | Discharge: 2017-09-06 | Disposition: A | Discharge status: Home / Self Care | Payer: Medicare Other | Attending: Emergency Medicine | Admitting: Emergency Medicine

## 2017-09-06 ENCOUNTER — Emergency Department (HOSPITAL_COMMUNITY): Payer: Medicare Other

## 2017-09-06 ENCOUNTER — Encounter (HOSPITAL_COMMUNITY): Payer: Self-pay | Admitting: Radiology

## 2017-09-06 DIAGNOSIS — E039 Hypothyroidism, unspecified: Secondary | ICD-10-CM | POA: Diagnosis not present

## 2017-09-06 DIAGNOSIS — Z7902 Long term (current) use of antithrombotics/antiplatelets: Secondary | ICD-10-CM | POA: Insufficient documentation

## 2017-09-06 DIAGNOSIS — W19XXXA Unspecified fall, initial encounter: Secondary | ICD-10-CM | POA: Insufficient documentation

## 2017-09-06 DIAGNOSIS — M25562 Pain in left knee: Secondary | ICD-10-CM | POA: Diagnosis not present

## 2017-09-06 DIAGNOSIS — Z9181 History of falling: Secondary | ICD-10-CM | POA: Insufficient documentation

## 2017-09-06 DIAGNOSIS — Z7984 Long term (current) use of oral hypoglycemic drugs: Secondary | ICD-10-CM | POA: Diagnosis not present

## 2017-09-06 DIAGNOSIS — I10 Essential (primary) hypertension: Secondary | ICD-10-CM | POA: Insufficient documentation

## 2017-09-06 DIAGNOSIS — E119 Type 2 diabetes mellitus without complications: Secondary | ICD-10-CM | POA: Insufficient documentation

## 2017-09-06 DIAGNOSIS — M542 Cervicalgia: Secondary | ICD-10-CM | POA: Insufficient documentation

## 2017-09-06 DIAGNOSIS — B372 Candidiasis of skin and nail: Secondary | ICD-10-CM | POA: Insufficient documentation

## 2017-09-06 DIAGNOSIS — F0390 Unspecified dementia without behavioral disturbance: Secondary | ICD-10-CM | POA: Diagnosis not present

## 2017-09-06 DIAGNOSIS — Z79899 Other long term (current) drug therapy: Secondary | ICD-10-CM | POA: Diagnosis not present

## 2017-09-06 DIAGNOSIS — B3789 Other sites of candidiasis: Secondary | ICD-10-CM

## 2017-09-06 LAB — CBG MONITORING, ED: Glucose-Capillary: 111 mg/dL — ABNORMAL HIGH (ref 65–99)

## 2017-09-06 MED ORDER — NYSTATIN 100000 UNIT/GM EX POWD: Freq: Two times a day (BID) | CUTANEOUS | 0 refills | Status: DC

## 2017-09-06 MED ORDER — NYSTATIN 100000 UNIT/GM EX POWD
Freq: Two times a day (BID) | CUTANEOUS | Status: DC
  Administered 2017-09-06: 13:00:00 via TOPICAL
  Filled 2017-09-06: qty 15

## 2017-09-06 NOTE — ED Notes (Signed)
Care of pt discussed with son. He reports they will be attempting to get her into assisted living

## 2017-09-06 NOTE — ED Notes (Signed)
Patient transported to CT 

## 2017-09-06 NOTE — ED Notes (Signed)
Upon pt arrival, pt noted to smell highly of urine. Pt was soaked through all her clothes with urine. The area around the patients genitalia is red a scaly looking. White yeast like clumps found in the fold between the leg and genitalia

## 2017-09-06 NOTE — ED Notes (Addendum)
CBG 111 

## 2017-09-06 NOTE — ED Notes (Signed)
RN spoke with pt's son, Larita FifeLynn, Pt's son states he was coming from [place] and would be assisting in getting her home

## 2017-09-06 NOTE — ED Triage Notes (Signed)
Per EMS: Pt is coming from [facility]. Pt was found prone on the floor by staff. Pt has a hx of dementia. Pt alert and oriented x2  Pt is complaining of back pain and right ankle pain. Pt had no deformity noted.  Pt has some redness around the eye but denies facial pain.  Pt's Vitals HR 68 RR 16 BP164/76 161 CBG Pt has urinary incontinence

## 2017-09-06 NOTE — ED Notes (Signed)
Bed: WA08 Expected date:  Expected time:  Means of arrival:  Comments: EMS-fall 

## 2017-09-06 NOTE — ED Provider Notes (Signed)
[facility]-EMERGENCY DEPT Provider Note   CSN: [id number] Arrival date & time: 09/06/17  1102     History   Chief Complaint Chief Complaint  Patient presents with  . Fall    HPI Carol Santiago is a 83 y.o. female.  83yo F w/ PMH including dementia, TIA, HTN, T2DM who p/w fall. Nursing facility found patient laying prone on floor this morning after apparent unwitnessed fall. She has complained of back and right ankle pain for them, for me she complains of mild neck pain and b/l shoulder stiffness.   LEVEL 5 CAVEAT DUE TO DEMENTIA   The history is provided by the EMS personnel and the nursing home.  Fall     Past Medical History:  Diagnosis Date  . Arthritis   . Dementia   . Diabetes mellitus type 2 with neurological manifestations (HCC)   . Hypertension   . Hypokalemia 11/2013  . Hypothyroid   . Osteoarthritis   . Pneumonia and influenza 11/2013  . TIA (transient ischemic attack)     Patient Active Problem List   Diagnosis Date Noted  . Recurrent falls 08/03/2017  . Stye 02/08/2017  . Leg swelling 02/08/2017  . Allergic rhinitis 01/27/2017  . Diarrhea 11/12/2016  . Acute blood loss anemia 08/11/2016  . Hematemesis 08/10/2016  . Routine general medical examination at a health care facility 05/09/2015  . Dyslipidemia 05/29/2013  . TIA (transient ischemic attack)   . Hypothyroid   . Dementia   . Hypertension   . Diabetes mellitus type 2 with neurological manifestations [facility](HCC)     Past Surgical History:  Procedure Laterality Date  . ABDOMINAL HYSTERECTOMY    . ESOPHAGOGASTRODUODENOSCOPY N/A 08/12/2016   Procedure: ESOPHAGOGASTRODUODENOSCOPY (EGD);  Surgeon: Beverley FiedlerJay M Pyrtle, MD;  Location: [facility];  Service: Endoscopy;  Laterality: N/A;  . Left knee surgery  2003  . TONSILLECTOMY  1938    OB History    No data available       Home Medications    Prior to Admission medications   Medication Sig Start Date End Date Taking?  Authorizing Provider  clopidogrel (PLAVIX) 75 MG tablet Take 1 tablet (75 mg total) by mouth daily. Please don't take for one week. 08/20/16   Maxie BarbBhandari, Dron Prasad, MD  donepezil (ARICEPT) 10 MG tablet Take 1 tablet (10 mg total) by mouth at bedtime. 08/06/16   Myrlene Brokerrawford, Elizabeth A, MD  levothyroxine (SYNTHROID, LEVOTHROID) 75 MCG tablet Take 1 tablet (75 mcg total) by mouth daily. Need office visit for further refills 08/18/17   Myrlene Brokerrawford, Elizabeth A, MD  lisinopril-hydrochlorothiazide (PRINZIDE,ZESTORETIC) 10-12.5 MG tablet TAKE ONE TABLET BY MOUTH ONCE DAILY 11/02/16   Myrlene Brokerrawford, Elizabeth A, MD  metFORMIN (GLUCOPHAGE) 500 MG tablet TAKE ONE TABLET BY MOUTH  DAILY WITH BREAKFAST 05/24/17   Veryl Speakalone, Gregory D, FNP  nystatin (MYCOSTATIN/NYSTOP) powder Apply topically 2 (two) times daily. Apply to affected red areas of skin twice daily until rash resolves 09/06/17   Darcella Shiffman, Ambrose Finlandachel Morgan, MD  olopatadine (PATANOL) 0.1 % ophthalmic solution Place 1 drop into the left eye 2 (two) times daily. Patient not taking: Reported on 08/02/2017 01/26/17   Myrlene Brokerrawford, Elizabeth A, MD  pravastatin (PRAVACHOL) 20 MG tablet Take 1 tablet (20 mg total) by mouth daily. 05/26/15   Myrlene Brokerrawford, Elizabeth A, MD    Family History Family History  Problem Relation Age of Onset  . Arthritis Mother   . Stroke Mother   . Hypertension Mother   . Colon cancer Father   .  Lung cancer Brother   . Kidney cancer Brother   . Diabetes Maternal Aunt     Social History Social History  Substance Use Topics  . Smoking status: Never Smoker  . Smokeless tobacco: Never Used  . Alcohol use No     Allergies   Patient has no known allergies.   Review of Systems Review of Systems  Unable to perform ROS: Dementia     Physical Exam Updated Vital Signs BP (!) 131/118   Pulse 78   Temp 98 F (36.7 C) (Oral)   Resp 20   SpO2 99%   Physical Exam  Constitutional: She appears well-developed and well-nourished. No distress.    HENT:  Head: Normocephalic and atraumatic.  Mild erythema L side of face and around L eye, non-tender   Eyes: Pupils are equal, round, and reactive to light. Conjunctivae are normal.  Neck:  In c-collar  Cardiovascular: Normal rate, regular rhythm and normal heart sounds.   No murmur heard. Pulmonary/Chest: Effort normal and breath sounds normal. She exhibits tenderness (mild left and central anterior chest wall).  Abdominal: Soft. Bowel sounds are normal. She exhibits no distension. There is no tenderness.  Musculoskeletal: She exhibits no edema.  Abrasion L lateral knee with mild swelling and limited ROM  Neurological: She is alert.  Mild confusion but oriented to person and place  Skin: Skin is warm and dry.  Erythema and foul-smelling white discharge in skin folds of lower abdomen and groin  Nursing note and vitals reviewed.    ED Treatments / Results  Labs (all labs ordered are listed, but only abnormal results are displayed) Labs Reviewed  CBG MONITORING, ED - Abnormal; Notable for the following:       Result Value   Glucose-Capillary 111 (*)    All other components within normal limits    EKG  EKG Interpretation None       Radiology Dg Chest 2 View  Result Date: 09/06/2017 CLINICAL DATA:  Unwitnessed fall. EXAM: CHEST  2 VIEW COMPARISON:  August 10, 2016 FINDINGS: There is mild left base atelectasis. There is no edema or consolidation. Heart is upper normal in size with pulmonary vascularity within normal limits. No adenopathy. No evident bone lesions. IMPRESSION: Mild left base atelectasis.  No edema or consolidation. Electronically Signed   By: Bretta Bang III M.D.   On: 09/06/2017 13:13   Ct Head Wo Contrast  Result Date: 09/06/2017 CLINICAL DATA:  Pain following fall EXAM: CT HEAD WITHOUT CONTRAST CT CERVICAL SPINE WITHOUT CONTRAST TECHNIQUE: Multidetector CT imaging of the head and cervical spine was performed following the standard protocol  without intravenous contrast. Multiplanar CT image reconstructions of the cervical spine were also generated. COMPARISON:  Head CT November 15, 2013; brain MRI June 14, 2013 FINDINGS: CT HEAD FINDINGS Brain: Moderate diffuse atrophy is stable. There is no intracranial mass, hemorrhage, extra-axial fluid collection, or midline shift. There is evidence of a stable prior infarct in the posterosuperior left frontal lobe. There is small vessel disease throughout the centra semiovale bilaterally. There is evidence of a prior infarct in the mid posterior left cerebellum posterior to the dentate nucleus. There are small lacunar infarcts in the anterior superior right cerebellum, stable. There also small infarcts in the mid posterolateral right cerebellum, stable. There is no new gray-white compartment lesion. No acute infarct evident. Vascular: No hyperdense vessels. There is calcification in each carotid siphon. Skull: The bony calvarium appears intact. Sinuses/Orbits: There is mucosal thickening in several ethmoid  air cells bilaterally. Visualized paranasal sinuses elsewhere are clear. Orbits appear symmetric bilaterally. Other: Mastoid air cells are clear. CT CERVICAL SPINE FINDINGS Alignment: There is no demonstrable spondylolisthesis. Skull base and vertebrae: Skull base and craniocervical junction regions appear normal. Bones are osteoporotic. No evident acute fracture. There are no blastic or lytic bone lesions. Soft tissues and spinal canal: Prevertebral soft tissues and predental space regions are normal. No paraspinous lesions. No evident cord or canal hematoma. Disc levels: There is mild disc space narrowing at C5-6. There are anterior osteophytes at C5 and C6. There is facet osteoarthritic change at several levels bilaterally. No evident disc extrusion or stenosis. Upper chest: Visualized upper lung zone regions appear normal. Other: There are foci of calcification in each carotid artery. There is slight  inhomogeneity in the thyroid without dominant mass. IMPRESSION: CT head: Atrophy with small vessel disease. Prior infarct posterior left frontal lobe. Cerebellar infarcts also are chronic and stable. No acute infarct. No mass, hemorrhage, or extra-axial fluid collection. Foci of arterial vascular calcification noted. Ethmoid sinus disease noted. CT cervical spine: No fracture or spondylolisthesis. Osteoarthritic changes several levels. Foci of carotid artery calcification bilaterally. Electronically Signed   By: Bretta Bang III M.D.   On: 09/06/2017 13:28   Ct Cervical Spine Wo Contrast  Result Date: 09/06/2017 CLINICAL DATA:  Pain following fall EXAM: CT HEAD WITHOUT CONTRAST CT CERVICAL SPINE WITHOUT CONTRAST TECHNIQUE: Multidetector CT imaging of the head and cervical spine was performed following the standard protocol without intravenous contrast. Multiplanar CT image reconstructions of the cervical spine were also generated. COMPARISON:  Head CT November 15, 2013; brain MRI June 14, 2013 FINDINGS: CT HEAD FINDINGS Brain: Moderate diffuse atrophy is stable. There is no intracranial mass, hemorrhage, extra-axial fluid collection, or midline shift. There is evidence of a stable prior infarct in the posterosuperior left frontal lobe. There is small vessel disease throughout the centra semiovale bilaterally. There is evidence of a prior infarct in the mid posterior left cerebellum posterior to the dentate nucleus. There are small lacunar infarcts in the anterior superior right cerebellum, stable. There also small infarcts in the mid posterolateral right cerebellum, stable. There is no new gray-white compartment lesion. No acute infarct evident. Vascular: No hyperdense vessels. There is calcification in each carotid siphon. Skull: The bony calvarium appears intact. Sinuses/Orbits: There is mucosal thickening in several ethmoid air cells bilaterally. Visualized paranasal sinuses elsewhere are clear. Orbits  appear symmetric bilaterally. Other: Mastoid air cells are clear. CT CERVICAL SPINE FINDINGS Alignment: There is no demonstrable spondylolisthesis. Skull base and vertebrae: Skull base and craniocervical junction regions appear normal. Bones are osteoporotic. No evident acute fracture. There are no blastic or lytic bone lesions. Soft tissues and spinal canal: Prevertebral soft tissues and predental space regions are normal. No paraspinous lesions. No evident cord or canal hematoma. Disc levels: There is mild disc space narrowing at C5-6. There are anterior osteophytes at C5 and C6. There is facet osteoarthritic change at several levels bilaterally. No evident disc extrusion or stenosis. Upper chest: Visualized upper lung zone regions appear normal. Other: There are foci of calcification in each carotid artery. There is slight inhomogeneity in the thyroid without dominant mass. IMPRESSION: CT head: Atrophy with small vessel disease. Prior infarct posterior left frontal lobe. Cerebellar infarcts also are chronic and stable. No acute infarct. No mass, hemorrhage, or extra-axial fluid collection. Foci of arterial vascular calcification noted. Ethmoid sinus disease noted. CT cervical spine: No fracture or spondylolisthesis. Osteoarthritic changes several  levels. Foci of carotid artery calcification bilaterally. Electronically Signed   By: Bretta Bang III M.D.   On: 09/06/2017 13:28   Dg Knee Complete 4 Views Left  Result Date: 09/06/2017 CLINICAL DATA:  Pain following fall EXAM: LEFT KNEE - COMPLETE 4+ VIEW COMPARISON:  None. FINDINGS: Frontal, lateral, and bilateral oblique views were obtained. There is no evident fracture or dislocation. No joint effusion. There is mild generalized soft tissue swelling. There is generalized joint space narrowing with spurring in all compartments. No erosive change. IMPRESSION: Extensive generalized osteoarthritic change. There is mild soft tissue swelling. No fracture or  joint effusion. Electronically Signed   By: Bretta Bang III M.D.   On: 09/06/2017 13:14    Procedures Procedures (including critical care time)  Medications Ordered in ED Medications  nystatin (MYCOSTATIN/NYSTOP) topical powder ( Topical Given 09/06/17 1320)     Initial Impression / Assessment and Plan / ED Course  I have reviewed the triage vital signs and the nursing notes.  Pertinent labs & imaging results that were available during my care of the patient were reviewed by me and considered in my medical decision making (see chart for details).     Pt brought in after unwitnessed fall from her nursing facility.  Stable vital signs at presentation.  Obtain CT of head and C-spine which were negative for acute injury.  Obtained x-ray of chest and knee which were also negative acute.  The patient did have pretty significant candidal skin infection in her groin and lower abdominal skin folds for which I have given nystatin.  She has otherwise been comfortable here and I feel she is safe for discharge.  Patient discharged back to nursing facility.  Final Clinical Impressions(s) / ED Diagnoses   Final diagnoses:  Fall, initial encounter  Candida rash of groin    New Prescriptions Discharge Medication List as of 09/06/2017  2:16 PM    START taking these medications   Details  nystatin (MYCOSTATIN/NYSTOP) powder Apply topically 2 (two) times daily. Apply to affected red areas of skin twice daily until rash resolves, Starting Tue 09/06/2017, Print         Sabre Romberger, Ambrose Finland, MD 09/06/17 1728

## 2017-09-06 NOTE — Discharge Instructions (Signed)
RETURN TO ER IF ANY VOMITING, LETHARGY, CHANGE IN BEHAVIOR, OR PROBLEMS MOVING ARMS/LEGS.

## 2017-09-08 ENCOUNTER — Encounter (HOSPITAL_COMMUNITY): Payer: Self-pay | Admitting: Internal Medicine

## 2017-09-08 ENCOUNTER — Emergency Department (HOSPITAL_COMMUNITY)
Admission: EM | Admit: 2017-09-08 | Discharge: 2017-09-08 | Disposition: A | Discharge status: Home / Self Care | Payer: Medicare Other | Attending: Emergency Medicine | Admitting: Emergency Medicine

## 2017-09-08 DIAGNOSIS — F0390 Unspecified dementia without behavioral disturbance: Secondary | ICD-10-CM | POA: Diagnosis not present

## 2017-09-08 DIAGNOSIS — Y939 Activity, unspecified: Secondary | ICD-10-CM | POA: Insufficient documentation

## 2017-09-08 DIAGNOSIS — I10 Essential (primary) hypertension: Secondary | ICD-10-CM | POA: Diagnosis not present

## 2017-09-08 DIAGNOSIS — E039 Hypothyroidism, unspecified: Secondary | ICD-10-CM | POA: Insufficient documentation

## 2017-09-08 DIAGNOSIS — E1149 Type 2 diabetes mellitus with other diabetic neurological complication: Secondary | ICD-10-CM | POA: Diagnosis not present

## 2017-09-08 DIAGNOSIS — Y999 Unspecified external cause status: Secondary | ICD-10-CM | POA: Insufficient documentation

## 2017-09-08 DIAGNOSIS — W19XXXA Unspecified fall, initial encounter: Secondary | ICD-10-CM | POA: Diagnosis not present

## 2017-09-08 DIAGNOSIS — Z79899 Other long term (current) drug therapy: Secondary | ICD-10-CM | POA: Diagnosis not present

## 2017-09-08 DIAGNOSIS — Y92122 Bedroom in nursing home as the place of occurrence of the external cause: Secondary | ICD-10-CM | POA: Diagnosis not present

## 2017-09-08 DIAGNOSIS — Z043 Encounter for examination and observation following other accident: Secondary | ICD-10-CM | POA: Diagnosis present

## 2017-09-08 DIAGNOSIS — Z7902 Long term (current) use of antithrombotics/antiplatelets: Secondary | ICD-10-CM | POA: Diagnosis not present

## 2017-09-08 NOTE — ED Notes (Signed)
Bed: WA14 Expected date:  Expected time:  Means of arrival:  Comments: EMS-fall 

## 2017-09-08 NOTE — ED Triage Notes (Signed)
[facility] unwitnessed [facility] this [facility]. She was found beside her bed after the fall. Pt was very talkative with EMS initially. Has dementia and is oriented to person only at this time.

## 2017-09-08 NOTE — ED Provider Notes (Signed)
[facility]-EMERGENCY DEPT Provider Note   CSN: [id number] Arrival date & time: 09/08/17  4098     History   Chief Complaint Chief Complaint  Patient presents with  . Fall    HPI Layani Foronda is a 83 y.o. female.  HPI Patient is a 83 year old female with an unwitnessed fall this morning in her apartment.  She was found sitting beside her bed.  Patient has no complaints at this time.  She does have dementia.  All of her joints can be fully ranged.  No reports of mental status changes by staff members.  No complaints for EMS.  Presents with stable vital signs   Past Medical History:  Diagnosis Date  . Arthritis   . Dementia   . Diabetes mellitus type 2 with neurological manifestations (HCC)   . Hypertension   . Hypokalemia 11/2013  . Hypothyroid   . Osteoarthritis   . Pneumonia and influenza 11/2013  . TIA (transient ischemic attack)     Patient Active Problem List   Diagnosis Date Noted  . Recurrent falls 08/03/2017  . Stye 02/08/2017  . Leg swelling 02/08/2017  . Allergic rhinitis 01/27/2017  . Diarrhea 11/12/2016  . Acute blood loss anemia 08/11/2016  . Hematemesis 08/10/2016  . Routine general medical examination at a health care facility 05/09/2015  . Dyslipidemia 05/29/2013  . TIA (transient ischemic attack)   . Hypothyroid   . Dementia   . Hypertension   . Diabetes mellitus type 2 with neurological manifestations [facility])     Past Surgical History:  Procedure Laterality Date  . ABDOMINAL HYSTERECTOMY    . ESOPHAGOGASTRODUODENOSCOPY N/A 08/12/2016   Procedure: ESOPHAGOGASTRODUODENOSCOPY (EGD);  Surgeon: Beverley Fiedler, MD;  Location: [facility];  Service: Endoscopy;  Laterality: N/A;  . Left knee surgery  2003  . TONSILLECTOMY  1938    OB History    No data available       Home Medications    Prior to Admission medications   Medication Sig Start Date End Date Taking? Authorizing Provider  clopidogrel (PLAVIX) 75 MG  tablet Take 1 tablet (75 mg total) by mouth daily. Please don't take for one week. 08/20/16   Maxie Barb, MD  donepezil (ARICEPT) 10 MG tablet Take 1 tablet (10 mg total) by mouth at bedtime. 08/06/16   [name], MD  levothyroxine (SYNTHROID, LEVOTHROID) 75 MCG tablet Take 1 tablet (75 mcg total) by mouth daily. Need office visit for further refills 08/18/17   [name], MD  lisinopril-hydrochlorothiazide (PRINZIDE,ZESTORETIC) 10-12.5 MG tablet TAKE ONE TABLET BY MOUTH ONCE DAILY 11/02/16   [name], MD  metFORMIN (GLUCOPHAGE) 500 MG tablet TAKE ONE TABLET BY MOUTH  DAILY WITH BREAKFAST 05/24/17   Veryl Speak, FNP  nystatin (MYCOSTATIN/NYSTOP) powder Apply topically 2 (two) times daily. Apply to affected red areas of skin twice daily until rash resolves Patient not taking: Reported on 09/08/2017 09/06/17   Little, Ambrose Finland, MD  olopatadine (PATANOL) 0.1 % ophthalmic solution Place 1 drop into the left eye 2 (two) times daily. Patient not taking: Reported on 08/02/2017 01/26/17   [name], MD  pravastatin (PRAVACHOL) 20 MG tablet Take 1 tablet (20 mg total) by mouth daily. 05/26/15   [name], MD    Family History Family History  Problem Relation Age of Onset  . Arthritis Mother   . Stroke Mother   . Hypertension Mother   . Colon cancer Father   .  Lung cancer Brother   . Kidney cancer Brother   . Diabetes Maternal Aunt     Social History Social History  Substance Use Topics  . Smoking status: Never Smoker  . Smokeless tobacco: Never Used  . Alcohol use No     Allergies   Patient has no known allergies.   Review of Systems Review of Systems  All other systems reviewed and are negative.    Physical Exam Updated Vital Signs BP (!) 150/53 (BP Location: Left Arm)   Pulse 66   Temp (!) 97.3 F (36.3 C) (Oral)   Resp 18   Ht 5\' 7"  (1.702 m)   Wt 64.9 kg (143 lb)   BMI 22.40 kg/m   Physical  Exam  Constitutional: She appears well-developed and well-nourished. No distress.  HENT:  Head: Normocephalic and atraumatic.  Eyes: EOM are normal.  Neck: Normal range of motion. Neck supple.  No c spine tenderness  Cardiovascular: Normal rate, regular rhythm and normal heart sounds.   Pulmonary/Chest: Effort normal and breath sounds normal.  Abdominal: Soft. She exhibits no distension. There is no tenderness.  Musculoskeletal: Normal range of motion.  Full range of motion of bilateral shoulders, elbows and wrists. Full range of motion of bilateral hips, knees and ankles.    Neurological: She is alert.  Skin: Skin is warm and dry.  Psychiatric: She has a normal mood and affect. Judgment normal.  Nursing note and vitals reviewed.    ED Treatments / Results  Labs (all labs ordered are listed, but only abnormal results are displayed) Labs Reviewed - No data to display  EKG  EKG Interpretation None       Radiology Dg Chest 2 View  Result Date: 09/06/2017 CLINICAL DATA:  Unwitnessed fall. EXAM: CHEST  2 VIEW COMPARISON:  August 10, 2016 FINDINGS: There is mild left base atelectasis. There is no edema or consolidation. Heart is upper normal in size with pulmonary vascularity within normal limits. No adenopathy. No evident bone lesions. IMPRESSION: Mild left base atelectasis.  No edema or consolidation. Electronically Signed   By: Bretta Bang III M.D.   On: 09/06/2017 13:13   Ct Head Wo Contrast  Result Date: 09/06/2017 CLINICAL DATA:  Pain following fall EXAM: CT HEAD WITHOUT CONTRAST CT CERVICAL SPINE WITHOUT CONTRAST TECHNIQUE: Multidetector CT imaging of the head and cervical spine was performed following the standard protocol without intravenous contrast. Multiplanar CT image reconstructions of the cervical spine were also generated. COMPARISON:  Head CT November 15, 2013; brain MRI June 14, 2013 FINDINGS: CT HEAD FINDINGS Brain: Moderate diffuse atrophy is stable.  There is no intracranial mass, hemorrhage, extra-axial fluid collection, or midline shift. There is evidence of a stable prior infarct in the posterosuperior left frontal lobe. There is small vessel disease throughout the centra semiovale bilaterally. There is evidence of a prior infarct in the mid posterior left cerebellum posterior to the dentate nucleus. There are small lacunar infarcts in the anterior superior right cerebellum, stable. There also small infarcts in the mid posterolateral right cerebellum, stable. There is no new gray-white compartment lesion. No acute infarct evident. Vascular: No hyperdense vessels. There is calcification in each carotid siphon. Skull: The bony calvarium appears intact. Sinuses/Orbits: There is mucosal thickening in several ethmoid air cells bilaterally. Visualized paranasal sinuses elsewhere are clear. Orbits appear symmetric bilaterally. Other: Mastoid air cells are clear. CT CERVICAL SPINE FINDINGS Alignment: There is no demonstrable spondylolisthesis. Skull base and vertebrae: Skull base and craniocervical junction regions  appear normal. Bones are osteoporotic. No evident acute fracture. There are no blastic or lytic bone lesions. Soft tissues and spinal canal: Prevertebral soft tissues and predental space regions are normal. No paraspinous lesions. No evident cord or canal hematoma. Disc levels: There is mild disc space narrowing at C5-6. There are anterior osteophytes at C5 and C6. There is facet osteoarthritic change at several levels bilaterally. No evident disc extrusion or stenosis. Upper chest: Visualized upper lung zone regions appear normal. Other: There are foci of calcification in each carotid artery. There is slight inhomogeneity in the thyroid without dominant mass. IMPRESSION: CT head: Atrophy with small vessel disease. Prior infarct posterior left frontal lobe. Cerebellar infarcts also are chronic and stable. No acute infarct. No mass, hemorrhage, or  extra-axial fluid collection. Foci of arterial vascular calcification noted. Ethmoid sinus disease noted. CT cervical spine: No fracture or spondylolisthesis. Osteoarthritic changes several levels. Foci of carotid artery calcification bilaterally. Electronically Signed   By: Bretta BangWilliam  Woodruff III M.D.   On: 09/06/2017 13:28   Ct Cervical Spine Wo Contrast  Result Date: 09/06/2017 CLINICAL DATA:  Pain following fall EXAM: CT HEAD WITHOUT CONTRAST CT CERVICAL SPINE WITHOUT CONTRAST TECHNIQUE: Multidetector CT imaging of the head and cervical spine was performed following the standard protocol without intravenous contrast. Multiplanar CT image reconstructions of the cervical spine were also generated. COMPARISON:  Head CT November 15, 2013; brain MRI June 14, 2013 FINDINGS: CT HEAD FINDINGS Brain: Moderate diffuse atrophy is stable. There is no intracranial mass, hemorrhage, extra-axial fluid collection, or midline shift. There is evidence of a stable prior infarct in the posterosuperior left frontal lobe. There is small vessel disease throughout the centra semiovale bilaterally. There is evidence of a prior infarct in the mid posterior left cerebellum posterior to the dentate nucleus. There are small lacunar infarcts in the anterior superior right cerebellum, stable. There also small infarcts in the mid posterolateral right cerebellum, stable. There is no new gray-white compartment lesion. No acute infarct evident. Vascular: No hyperdense vessels. There is calcification in each carotid siphon. Skull: The bony calvarium appears intact. Sinuses/Orbits: There is mucosal thickening in several ethmoid air cells bilaterally. Visualized paranasal sinuses elsewhere are clear. Orbits appear symmetric bilaterally. Other: Mastoid air cells are clear. CT CERVICAL SPINE FINDINGS Alignment: There is no demonstrable spondylolisthesis. Skull base and vertebrae: Skull base and craniocervical junction regions appear normal. Bones  are osteoporotic. No evident acute fracture. There are no blastic or lytic bone lesions. Soft tissues and spinal canal: Prevertebral soft tissues and predental space regions are normal. No paraspinous lesions. No evident cord or canal hematoma. Disc levels: There is mild disc space narrowing at C5-6. There are anterior osteophytes at C5 and C6. There is facet osteoarthritic change at several levels bilaterally. No evident disc extrusion or stenosis. Upper chest: Visualized upper lung zone regions appear normal. Other: There are foci of calcification in each carotid artery. There is slight inhomogeneity in the thyroid without dominant mass. IMPRESSION: CT head: Atrophy with small vessel disease. Prior infarct posterior left frontal lobe. Cerebellar infarcts also are chronic and stable. No acute infarct. No mass, hemorrhage, or extra-axial fluid collection. Foci of arterial vascular calcification noted. Ethmoid sinus disease noted. CT cervical spine: No fracture or spondylolisthesis. Osteoarthritic changes several levels. Foci of carotid artery calcification bilaterally. Electronically Signed   By: Bretta BangWilliam  Woodruff III M.D.   On: 09/06/2017 13:28   Dg Knee Complete 4 Views Left  Result Date: 09/06/2017 CLINICAL DATA:  Pain following  fall EXAM: LEFT KNEE - COMPLETE 4+ VIEW COMPARISON:  None. FINDINGS: Frontal, lateral, and bilateral oblique views were obtained. There is no evident fracture or dislocation. No joint effusion. There is mild generalized soft tissue swelling. There is generalized joint space narrowing with spurring in all compartments. No erosive change. IMPRESSION: Extensive generalized osteoarthritic change. There is mild soft tissue swelling. No fracture or joint effusion. Electronically Signed   By: Bretta Bang III M.D.   On: 09/06/2017 13:14    Procedures Procedures (including critical care time)  Medications Ordered in ED Medications - No data to display   Initial Impression /  Assessment and Plan / ED Course  I have reviewed the triage vital signs and the nursing notes.  Pertinent labs & imaging results that were available during my care of the patient were reviewed by me and considered in my medical decision making (see chart for details).     Patient is overall well-appearing.  No signs of trauma to her head.  Full range of motion of her neck.  Full range of motion of her major joints.  Stable vital signs.  No indication for additional or aggressive workup in the emergency department.  Final Clinical Impressions(s) / ED Diagnoses   Final diagnoses:  Fall, initial encounter    New Prescriptions New Prescriptions   No medications on file     Azalia Bilis, MD 09/08/17 [phone]

## 2017-09-08 NOTE — ED Notes (Signed)
When I Called Patient's son, Randal, back, was explaning that was up for discharge. Son was concerned about ED [facility] with her recent amount of falls and number of visits to ED with her being such a high fall risk and will more than likely fall again "and heaven forbid she doesn't fall and break a hip".  Explained to son that in the ED we treat the injuries and unfortunately if patient doesn't meet medical requirements for admission then it's the insurance that will not pay and not anything the ED can control.  Randal informed me that he and his brother are already working on getting their mother in higher skilled room there at same facility but she doesn't see PCP to get FL-2 until Monday.  I informed Randal that they could contact a private duty aid service to come stay with their mother until they can get her transferred to higher skilled bed.  Randal sounding agitated states, "Well, it takes time to do all the paperwork and [place] as we [place].  But I just can't believe yall can send a 83 year old lady back knowing she may fall again.  Can you see if doctor can get her into rehab for PT since she is falling so much?  We know of a place that she can go, because few years back when she was admitted for PNA they offered to send her there then".  Informed Randal that I would go speak with EDP about his concerns.

## 2017-09-08 NOTE — ED Notes (Signed)
Made Dr Patria Maneampos aware of Randal, pt's son, concerns.   Dr Patria Maneampos calling son.

## 2017-09-12 ENCOUNTER — Encounter: Payer: Self-pay | Admitting: Internal Medicine

## 2017-09-12 ENCOUNTER — Ambulatory Visit (INDEPENDENT_AMBULATORY_CARE_PROVIDER_SITE_OTHER): Payer: Medicare Other | Admitting: Internal Medicine

## 2017-09-12 VITALS — BP 110/60 | HR 104 | Temp 98.5°F | Ht 67.0 in | Wt 180.0 lb

## 2017-09-12 DIAGNOSIS — G309 Alzheimer's disease, unspecified: Secondary | ICD-10-CM | POA: Diagnosis not present

## 2017-09-12 DIAGNOSIS — F0281 Dementia in other diseases classified elsewhere with behavioral disturbance: Secondary | ICD-10-CM | POA: Diagnosis not present

## 2017-09-12 DIAGNOSIS — Z111 Encounter for screening for respiratory tuberculosis: Secondary | ICD-10-CM | POA: Diagnosis not present

## 2017-09-12 NOTE — Progress Notes (Signed)
   Subjective:    Patient ID: Carol DunkerMarjorie Santiago, female    DOB: 10-22-1925, 83 y.o.   MRN: [id number]  HPI The patient is a 83 YO female coming in for dementia with her son and daughter in law. She is not able to provide history. She has had decline in the last month and stable changes since that time. She has been to the emergency room several times without cause. 3 falls in the last 10 days. She is not able to feed herself, dress herself, walk at all. She is able to chew food. No choking or coughing with eating. Using diapers for bowel and bladder incontinence.   Review of Systems  Unable to perform ROS: Dementia  Constitutional: Positive for activity change and appetite change.      Objective:   Physical Exam  Constitutional: She appears well-developed. No distress.  HENT:  Head: Normocephalic and atraumatic.  Cardiovascular: Normal rate and regular rhythm.   Pulmonary/Chest: Effort normal and breath sounds normal. No respiratory distress. She has no wheezes.  Abdominal: Soft. She exhibits no distension. There is no tenderness. There is no rebound.  Musculoskeletal: She exhibits no edema.  Neurological: Coordination abnormal.  Not able to answer questions or follow 1 step command  Skin: Skin is warm and dry.   Vitals:   09/12/17 1503  BP: 110/60  Pulse: (!) 104  Temp: 98.5 F (36.9 C)  TempSrc: Oral  SpO2: 98%  Weight: 180 lb (81.6 kg)  Height: 5\' 7"  (1.702 m)   PPD placed at the visit    Assessment & Plan:

## 2017-09-12 NOTE — Assessment & Plan Note (Signed)
With behavioral changes. No signs for reversible etiology today. Referral to hospice and FL2 filled out. Advised family that I do not feel she is a candidate for ALF and recommended SNF.

## 2017-09-13 ENCOUNTER — Other Ambulatory Visit: Payer: Self-pay | Admitting: Internal Medicine

## 2017-09-13 ENCOUNTER — Telehealth: Payer: Self-pay | Admitting: Internal Medicine

## 2017-09-13 ENCOUNTER — Telehealth: Payer: Self-pay

## 2017-09-13 NOTE — Telephone Encounter (Signed)
Called back and fixed the FL2 and faxed over the fixed version with medication list and PPD letter

## 2017-09-13 NOTE — Telephone Encounter (Signed)
Received FL2.  States that FL2 has skilled nursing facility checked.  Needs FL2 with recommended assisted living checked.  Needs to know if patient has had a first step TB test.  Also, needs medications listed.  If sending a separate med list that needs to be signed by MD.  Please call back regard.

## 2017-09-13 NOTE — Telephone Encounter (Signed)
Called hospice and informed of the referral

## 2017-09-14 ENCOUNTER — Other Ambulatory Visit: Payer: Self-pay | Admitting: Internal Medicine

## 2017-09-18 ENCOUNTER — Emergency Department (HOSPITAL_COMMUNITY)

## 2017-09-18 ENCOUNTER — Emergency Department (HOSPITAL_COMMUNITY)
Admission: EM | Admit: 2017-09-18 | Discharge: 2017-09-18 | Disposition: A | Discharge status: Skilled Nursing Facility | Attending: Emergency Medicine | Admitting: Emergency Medicine

## 2017-09-18 ENCOUNTER — Encounter (HOSPITAL_COMMUNITY): Payer: Self-pay | Admitting: Internal Medicine

## 2017-09-18 DIAGNOSIS — S7002XA Contusion of left hip, initial encounter: Secondary | ICD-10-CM | POA: Insufficient documentation

## 2017-09-18 DIAGNOSIS — Y999 Unspecified external cause status: Secondary | ICD-10-CM | POA: Diagnosis not present

## 2017-09-18 DIAGNOSIS — Y939 Activity, unspecified: Secondary | ICD-10-CM | POA: Insufficient documentation

## 2017-09-18 DIAGNOSIS — F0390 Unspecified dementia without behavioral disturbance: Secondary | ICD-10-CM | POA: Insufficient documentation

## 2017-09-18 DIAGNOSIS — N390 Urinary tract infection, site not specified: Secondary | ICD-10-CM | POA: Insufficient documentation

## 2017-09-18 DIAGNOSIS — E119 Type 2 diabetes mellitus without complications: Secondary | ICD-10-CM | POA: Insufficient documentation

## 2017-09-18 DIAGNOSIS — Z7984 Long term (current) use of oral hypoglycemic drugs: Secondary | ICD-10-CM | POA: Insufficient documentation

## 2017-09-18 DIAGNOSIS — Z8673 Personal history of transient ischemic attack (TIA), and cerebral infarction without residual deficits: Secondary | ICD-10-CM | POA: Insufficient documentation

## 2017-09-18 DIAGNOSIS — S098XXA Other specified injuries of head, initial encounter: Secondary | ICD-10-CM | POA: Diagnosis present

## 2017-09-18 DIAGNOSIS — Z79899 Other long term (current) drug therapy: Secondary | ICD-10-CM | POA: Diagnosis not present

## 2017-09-18 DIAGNOSIS — S0012XA Contusion of left eyelid and periocular area, initial encounter: Secondary | ICD-10-CM | POA: Insufficient documentation

## 2017-09-18 DIAGNOSIS — W1830XA Fall on same level, unspecified, initial encounter: Secondary | ICD-10-CM | POA: Insufficient documentation

## 2017-09-18 DIAGNOSIS — W19XXXA Unspecified fall, initial encounter: Secondary | ICD-10-CM

## 2017-09-18 DIAGNOSIS — E039 Hypothyroidism, unspecified: Secondary | ICD-10-CM | POA: Insufficient documentation

## 2017-09-18 DIAGNOSIS — Y92122 Bedroom in nursing home as the place of occurrence of the external cause: Secondary | ICD-10-CM | POA: Diagnosis not present

## 2017-09-18 LAB — URINALYSIS, ROUTINE W REFLEX MICROSCOPIC
BILIRUBIN URINE: NEGATIVE
Glucose, UA: NEGATIVE mg/dL
KETONES UR: NEGATIVE mg/dL
NITRITE: NEGATIVE
PROTEIN: 100 mg/dL — AB
Specific Gravity, Urine: 1.02 (ref 1.005–1.030)
pH: 5 (ref 5.0–8.0)

## 2017-09-18 LAB — COMPREHENSIVE METABOLIC PANEL
ALBUMIN: 2.8 g/dL — AB (ref 3.5–5.0)
ALT: 26 U/L (ref 14–54)
ANION GAP: 11 (ref 5–15)
AST: 34 U/L (ref 15–41)
Alkaline Phosphatase: 116 U/L (ref 38–126)
BUN: 36 mg/dL — ABNORMAL HIGH (ref 6–20)
CALCIUM: 9.1 mg/dL (ref 8.9–10.3)
CO2: 30 mmol/L (ref 22–32)
CREATININE: 0.69 mg/dL (ref 0.44–1.00)
Chloride: 100 mmol/L — ABNORMAL LOW (ref 101–111)
GFR calc Af Amer: >60 mL/min (ref >60)
GLUCOSE: 169 mg/dL — AB (ref 65–99)
Potassium: 4.2 mmol/L (ref 3.5–5.1)
Sodium: 141 mmol/L (ref 135–145)
TOTAL PROTEIN: 6.8 g/dL (ref 6.5–8.1)
Total Bilirubin: 2.5 mg/dL — ABNORMAL HIGH (ref 0.3–1.2)

## 2017-09-18 LAB — CBC
HCT: 42.7 % (ref 36.0–46.0)
Hemoglobin: 14 g/dL (ref 12.0–15.0)
MCH: 33.8 pg (ref 26.0–34.0)
MCHC: 32.8 g/dL (ref 30.0–36.0)
MCV: 103.1 fL — AB (ref 78.0–100.0)
PLATELETS: 213 10*3/uL (ref 150–400)
RBC: 4.14 MIL/uL (ref 3.87–5.11)
RDW: 14.2 % (ref 11.5–15.5)
WBC: 12.7 10*3/uL — AB (ref 4.0–10.5)

## 2017-09-18 MED ORDER — CEPHALEXIN 500 MG PO CAPS
500.0000 mg | ORAL_CAPSULE | Freq: Once | ORAL | Status: AC
  Administered 2017-09-18: 500 mg via ORAL
  Filled 2017-09-18: qty 1

## 2017-09-18 MED ORDER — ACETAMINOPHEN 325 MG PO TABS
650.0000 mg | ORAL_TABLET | Freq: Once | ORAL | Status: AC
  Administered 2017-09-18: 650 mg via ORAL
  Filled 2017-09-18: qty 2

## 2017-09-18 MED ORDER — CEPHALEXIN 500 MG PO CAPS: ORAL_CAPSULE | ORAL | 0 refills | Status: DC

## 2017-09-18 NOTE — ED Provider Notes (Signed)
[facility]-EMERGENCY DEPT Provider Note   CSN: [id number] Arrival date & time: 09/18/17  7829     History   Chief Complaint Chief Complaint  Patient presents with  . Fall    HPI  Carol Santiago is a 83 y.o. Female with a history of dementia, diabetes, hypertension, and multiple recent falls, who presents via EMS after an unwitnessed fall at her independent living facility, [facility].  Has had 4 falls in the last few weeks, and is in the process of being transitioned to total care.  Patient's son reports he went into the room this morning and found the patient on the floor, staff report she was normal last night. Pt is aware she fell, reports she was trying to get to the bathroom. Pt is unsure if she hit her head, and is denying any focal pain. Son reports pt seems a bit more confused than usual, but also states that for the past few weeks pts mental status has been waxing and waning. Reports he was able to have full conversation with pt earlier this morning after the fall. Son reports bruising and swelling of left eye is new and son reports she has some bumps and bruises from previous falls, but is unsure of any obvious injury today. He is concerned about hip fracture. Reports at baseline pt is very fidgety, and is unable to walk well.  Level V caveat: Dementia       Past Medical History:  Diagnosis Date  . Arthritis   . Dementia   . Diabetes mellitus type 2 with neurological manifestations (HCC)   . Hypertension   . Hypokalemia 11/2013  . Hypothyroid   . Osteoarthritis   . Pneumonia and influenza 11/2013  . TIA (transient ischemic attack)     Patient Active Problem List   Diagnosis Date Noted  . Recurrent falls 08/03/2017  . Stye 02/08/2017  . Leg swelling 02/08/2017  . Allergic rhinitis 01/27/2017  . Diarrhea 11/12/2016  . Acute blood loss anemia 08/11/2016  . Hematemesis 08/10/2016  . Routine general medical examination at a health care  facility 05/09/2015  . Dyslipidemia 05/29/2013  . TIA (transient ischemic attack)   . Hypothyroid   . Dementia   . Hypertension   . Diabetes mellitus type 2 with neurological manifestations [facility])     Past Surgical History:  Procedure Laterality Date  . ABDOMINAL HYSTERECTOMY    . Left knee surgery  2003  . TONSILLECTOMY  1938    OB History    No data available       Home Medications    Prior to Admission medications   Medication Sig Start Date End Date Taking? Authorizing Provider  clopidogrel (PLAVIX) 75 MG tablet Take 1 tablet (75 mg total) by mouth daily. Please don't take for one week. 08/20/16   Maxie Barb, MD  donepezil (ARICEPT) 10 MG tablet Take 1 tablet (10 mg total) by mouth at bedtime. 08/06/16   [name], MD  levothyroxine (SYNTHROID, LEVOTHROID) 75 MCG tablet Take 1 tablet (75 mcg total) by mouth daily. Need office visit for further refills 08/18/17   [name], MD  lisinopril-hydrochlorothiazide (PRINZIDE,ZESTORETIC) 10-12.5 MG tablet TAKE ONE TABLET BY MOUTH ONCE DAILY 11/02/16   [name], MD  metFORMIN (GLUCOPHAGE) 500 MG tablet TAKE ONE TABLET BY MOUTH  DAILY WITH BREAKFAST 05/24/17   Veryl Speak, FNP  nystatin (MYCOSTATIN/NYSTOP) powder Apply topically 2 (two) times daily. Apply to affected red areas  of skin twice daily until rash resolves 09/06/17   Little, Ambrose Finland, MD  olopatadine (PATANOL) 0.1 % ophthalmic solution Place 1 drop into the left eye 2 (two) times daily. Patient not taking: Reported on 08/02/2017 01/26/17   [name], MD  pravastatin (PRAVACHOL) 20 MG tablet Take 1 tablet (20 mg total) by mouth daily. 05/26/15   [name], MD    Family History Family History  Problem Relation Age of Onset  . Arthritis Mother   . Stroke Mother   . Hypertension Mother   . Colon cancer Father   . Lung cancer Brother   . Kidney cancer Brother   . Diabetes Maternal Aunt     Social  History Social History   Tobacco Use  . Smoking status: Never Smoker  . Smokeless tobacco: Never Used  Substance Use Topics  . Alcohol use: No  . Drug use: No     Allergies   Patient has no known allergies.   Review of Systems Review of Systems  Unable to perform ROS: Dementia     Physical Exam Updated Vital Signs BP 120/65 (BP Location: Left Arm)   Pulse (!) 105   Temp 98.1 F (36.7 C) (Oral)   Resp 16   SpO2 96%   Physical Exam  Constitutional: She appears well-developed and well-nourished. No distress.  HENT:  Bruising and swelling around left eye, no battles sign, no hemotympanum, no hematomas no tenderness on palpation of the scalp of elsewhere on the face  Eyes: Right eye exhibits no discharge. Left eye exhibits no discharge.  Neck: Neck supple.  No grimace with palpation of neck  Cardiovascular: Normal rate, regular rhythm, normal heart sounds and intact distal pulses.  Pulmonary/Chest: Effort normal and breath sounds normal. No stridor. No respiratory distress. She has no wheezes. She has no rales. She exhibits no tenderness.  Abdominal: Soft. Bowel sounds are normal. She exhibits no distension and no mass. There is no tenderness. There is no guarding.  Musculoskeletal:  No grimace with palaption of upper extremities, full ROM, 2+ radial pulses Pt grimaces with palpation of both hips, right foot is held in external rotation, able to move pt through passive ROM, 2+ distal pulses in bilateral LE.   Neurological: She is alert.  Pt is alert but unoriented to person place or time. Pt able to follow simple commands, unable to localize pain. Able to move all extremities, equal grip strength, able to wiggle all toes. Difficulty lifting legs off bed, but pt has difficulty walking at baseline. Sensation intact in all extremities  Skin: Skin is warm and dry. Capillary refill takes less than 2 seconds. She is not diaphoretic.  No lacerations noted. Abrasion ot left elbow  noted, reported from previous fall, well-healing,  Psychiatric: She has a normal mood and affect. Her behavior is normal.  Nursing note and vitals reviewed.    ED Treatments / Results  Labs (all labs ordered are listed, but only abnormal results are displayed) Labs Reviewed  CBC - Abnormal; Notable for the following components:      Result Value   WBC 12.7 (*)    MCV 103.1 (*)    All other components within normal limits  COMPREHENSIVE METABOLIC PANEL - Abnormal; Notable for the following components:   Chloride 100 (*)    Glucose, Bld 169 (*)    BUN 36 (*)    Albumin 2.8 (*)    Total Bilirubin 2.5 (*)    All other components  within normal limits  URINALYSIS, ROUTINE W REFLEX MICROSCOPIC - Abnormal; Notable for the following components:   Color, Urine AMBER (*)    APPearance CLOUDY (*)    Hgb urine dipstick SMALL (*)    Protein, ur 100 (*)    Leukocytes, UA SMALL (*)    Bacteria, UA MANY (*)    Squamous Epithelial / LPF 0-5 (*)    All other components within normal limits  URINE CULTURE    EKG  EKG Interpretation None       Radiology Dg Chest 2 View  Result Date: 09/18/2017 CLINICAL DATA:  83 year old female found down after fall. EXAM: CHEST  2 VIEW COMPARISON:  09/06/2017 FINDINGS: Cardiomediastinal silhouette is unchanged in size and morphology. Interval development of left basilar opacity most suggestive of an effusion and associated atelectasis. Superimposed consolidation not excluded. The right lung is clear. No pneumothorax. No acute osseous abnormalities. IMPRESSION: Interval development of a left basilar effusion and associated atelectasis, with or without superimposed consolidation. Electronically Signed   By: Sande Brothers M.D.   On: 09/18/2017 11:24   Ct Head Wo Contrast  Result Date: 09/18/2017 CLINICAL DATA:  83 year old female with unwitnessed fall today. Bruising and swelling noted to the left side. EXAM: CT HEAD WITHOUT CONTRAST CT CERVICAL SPINE  WITHOUT CONTRAST TECHNIQUE: Multidetector CT imaging of the head and cervical spine was performed following the standard protocol without intravenous contrast. Multiplanar CT image reconstructions of the cervical spine were also generated. COMPARISON:  09/06/2017 FINDINGS: CT HEAD FINDINGS Brain: No evidence of acute infarction, hemorrhage, hydrocephalus, extra-axial collection or mass lesion/mass effect. Moderate diffuse parenchymal atrophy and periventricular white matter changes, likely representing small vessel ischemic disease, are again noted. Stable changes of prior in fracture noted in the left frontal lobe, posterior left cerebellum and throughout the right cerebellum. Vascular: No hyperdense vessel. Calcifications again noted in the carotid siphons. Skull: Normal. Negative for fracture or focal lesion. Sinuses/Orbits: No acute finding. Other: None. CT CERVICAL SPINE FINDINGS Alignment: Normal. Straightening of the normal cervical curvature is likely positional in nature. Skull base and vertebrae: No acute fracture. No primary bone lesion or focal pathologic process. Soft tissues and spinal canal: No prevertebral fluid or swelling. No visible canal hematoma. Disc levels: There are mild to moderate multilevel discogenic degenerative changes. Upper chest: Negative. Other: Scattered calcific foci again noted in the carotid arteries. IMPRESSION: 1. No acute intracranial pathology. Changes of prior infarcts are noted in the left frontal lobe and cerebellum. Chronic changes of parenchymal atrophy and small vessel ischemic disease are also unchanged. 2. No evidence for cervical fracture or spondylolisthesis. Multilevel discogenic degenerative changes similar to prior study. Electronically Signed   By: Sande Brothers M.D.   On: 09/18/2017 11:21   Ct Cervical Spine Wo Contrast  Result Date: 09/18/2017 CLINICAL DATA:  83 year old female with unwitnessed fall today. Bruising and swelling noted to the left side.  EXAM: CT HEAD WITHOUT CONTRAST CT CERVICAL SPINE WITHOUT CONTRAST TECHNIQUE: Multidetector CT imaging of the head and cervical spine was performed following the standard protocol without intravenous contrast. Multiplanar CT image reconstructions of the cervical spine were also generated. COMPARISON:  09/06/2017 FINDINGS: CT HEAD FINDINGS Brain: No evidence of acute infarction, hemorrhage, hydrocephalus, extra-axial collection or mass lesion/mass effect. Moderate diffuse parenchymal atrophy and periventricular white matter changes, likely representing small vessel ischemic disease, are again noted. Stable changes of prior in fracture noted in the left frontal lobe, posterior left cerebellum and throughout the right cerebellum. Vascular: No  hyperdense vessel. Calcifications again noted in the carotid siphons. Skull: Normal. Negative for fracture or focal lesion. Sinuses/Orbits: No acute finding. Other: None. CT CERVICAL SPINE FINDINGS Alignment: Normal. Straightening of the normal cervical curvature is likely positional in nature. Skull base and vertebrae: No acute fracture. No primary bone lesion or focal pathologic process. Soft tissues and spinal canal: No prevertebral fluid or swelling. No visible canal hematoma. Disc levels: There are mild to moderate multilevel discogenic degenerative changes. Upper chest: Negative. Other: Scattered calcific foci again noted in the carotid arteries. IMPRESSION: 1. No acute intracranial pathology. Changes of prior infarcts are noted in the left frontal lobe and cerebellum. Chronic changes of parenchymal atrophy and small vessel ischemic disease are also unchanged. 2. No evidence for cervical fracture or spondylolisthesis. Multilevel discogenic degenerative changes similar to prior study. Electronically Signed   By: Sande BrothersSerena  Chacko M.D.   On: 09/18/2017 11:21   Ct Pelvis Wo Contrast  Result Date: 09/18/2017 CLINICAL DATA:  Fall last night/ DG xrays non-conclusive. EXAM: CT  PELVIS WITHOUT CONTRAST TECHNIQUE: Multidetector CT imaging of the pelvis was performed following the standard protocol without intravenous contrast. COMPARISON:  Current pelvis radiographs. Previous pelvis CT, 01/22/2016 FINDINGS: Urinary Tract:  Normal bladder.  Distal ureters normal in caliber. Bowel: No acute finding of the visualized bowel. Multiple sigmoid colon diverticula. No diverticulitis. Vascular/Lymphatic: Atherosclerotic calcifications along the iliac vessels. No aneurysm. No adenopathy. Reproductive:  Uterus and adnexa are unremarkable. Other:  No abdominal wall hernia.  No pelvic free fluid. Musculoskeletal: No fracture. No bone lesion. Bones are demineralized. Hip joints are normally aligned. No convincing joint effusion. IMPRESSION: 1. No fracture, dislocation or acute abnormality. Electronically Signed   By: Amie Portlandavid  Ormond M.D.   On: 09/18/2017 12:44   Dg Hips Bilat W Or Wo Pelvis 5 Views  Result Date: 09/18/2017 CLINICAL DATA:  83 year old female status post fall. Patient has bruising to the left hip which may be old. EXAM: DG HIP (WITH OR WITHOUT PELVIS) 5+V BILAT COMPARISON:  01/22/2016. FINDINGS: There is no definite radiographic evidence for acute hip fracture or dislocation. However, the patient is unable to adequately externally rotate the left or right hip, limiting evaluation in this region. Note is made of diffuse degenerative changes without focal osseous lesion. IMPRESSION: No definite radiographic evidence for acute hip fracture or dislocation. However, evaluation of the right hip is limited due to inadequate external rotation. This could be a sign of injury to this region. If there is high clinical suspicion for occult fracture, further evaluation with MRI or CT should be considered. Electronically Signed   By: Sande BrothersSerena  Chacko M.D.   On: 09/18/2017 11:34    Procedures Procedures (including critical care time)  Medications Ordered in ED Medications  acetaminophen (TYLENOL)  tablet 650 mg (650 mg Oral Given 09/18/17 1018)  cephALEXin (KEFLEX) capsule 500 mg (500 mg Oral Given 09/18/17 1415)     Initial Impression / Assessment and Plan / ED Course  I have reviewed the triage vital signs and the nursing notes.  Pertinent labs & imaging results that were available during my care of the patient were reviewed by me and considered in my medical decision making (see chart for details).  Pt presents after unwitnessed fall. Vitals are normal. Bruising to left orbit, pain with palpation of bilateral hips, exam otherwise non-focal. Pt has had several falls in last few weeks. Pt able to contribute very little to hx, primarily obtained from sons. Will obtain CT head and c-spine, CXR  and bilateral hip XRs, as well as CBC, CMP and UA.  Imaging negative for acute intracranial abnormality or abnormality of c-spine. CXR shows left basilar atelectasis with small effusion, cannot rule out overlying opacity. Pt shows no signs of respiratory distress, no increase in respiratory effort and no hypoxia, doubt pneumonia. Hip XR shows no fracture of dislocation of left, unable to fully eval right hip 2/2 inadequate external rotation. Followed up with CT pelvis, which was negative for fracture or dislocation.  UA concerning for urinary tract infection. Small leukocytosis. Kidney function is normal. Pt continues to exhibit normal vitals throughout ED stay, not concerned for pyelonephritis or sepsis. Labs otherwise unremarkable. Pt stable for discharge back to facility with outpt treatment of UTI with Keflex, first dose given in ED. Family expressed concern about pt returning to assisted living facility and falling again before she is transferred to total care. Spoke with pt's hospice coordinator who reports family has the option at this facility to have some stay with the pt for 245 hrs if necessary, recommended this to family, spoke with social Worker Johnathan Riffey, who spoke with family and  recommended the same. Return precautions provided, family expresses understanding and agrees with plan. Pt will be transported back to facility via [facility].  Patient discussed with Dr. Madilyn Hook, who saw patient as well and agrees with plan.   Final Clinical Impressions(s) / ED Diagnoses   Final diagnoses:  Fall, initial encounter  Lower urinary tract infectious disease    New Prescriptions This SmartLink is deprecated. Use AVSMEDLIST instead to display the medication list for a patient.   Dartha Lodge, PA-C 09/18/17 1751    Tilden Fossa, MD 09/20/17 [phone]

## 2017-09-18 NOTE — ED Triage Notes (Addendum)
[facility]. Patient's son went into pt's room this morning and found her on the floor. The staff last saw her normal last night. Bruising and swelling noted to left eye. Bruise to the left hip is reported to be old. Denies head neck or back pain. Pt is not on blood thinners and does not remember falling.

## 2017-09-18 NOTE — ED Notes (Signed)
Bed: WA20 Expected date:  Expected time:  Means of arrival:  Comments: 83 yo fall; eye injury

## 2017-09-18 NOTE — ED Notes (Signed)
No urine noted in suction canister at this time

## 2017-09-18 NOTE — Discharge Instructions (Addendum)
Workup is reassuring, imaging showed no fracture or traumatic injury from the fall.  Patient does have a urinary tract infection, please complete course of antibiotics as directed.  Tylenol as needed for pain. if symptoms are not improving, patient develops persistent fevers, or condition appears to be worsening please return to the ED for sooner evaluation, otherwise please follow-up with patient's primary doctor.

## 2017-09-18 NOTE — ED Notes (Signed)
Pt has pure wick placed to collect urine

## 2017-09-18 NOTE — ED Notes (Signed)
Spoke with lab and notified them of urine culture add on

## 2017-09-18 NOTE — ED Notes (Signed)
ED Provider at bedside. 

## 2017-09-18 NOTE — ED Notes (Addendum)
Patient transported to CT/Xray. 

## 2017-09-18 NOTE — ED Notes (Signed)
[facility] here for transport. 

## 2017-09-18 NOTE — ED Notes (Addendum)
ED Provider at bedside. 

## 2017-09-18 NOTE — ED Notes (Signed)
[facility] notified of patient transport  

## 2017-09-18 NOTE — Progress Notes (Addendum)
CSW received a call from ED secretary stating a consult was placed for CM, but documentation looked like family did not want to return to the Independent Living Facility [facility] due to the pt being a potential fall risk and also having a fall prior to admission.    CSW spoke to the provider and the pt is now medically cleared.  CSW reviewed pt's chart and spoke to the provider who spoke to the pt's Hospice Coordinator.  Per the provider the pt has placement set up and is to be admitted there on Tuesday.  Per the provider the Hospice Coordinator states pt has an in-home aide for 8 hours a day now and pt/family needs to coordinate with the facility to increase the aide's hours from 8 to 24 hours a day until the pt's placement on Tuesday.    CSW [facility] to inform CM of the consult, who stated that this "seems sufficient to me".  CSW spoke to CSW Asst Director to ensure this plan is safe and sufficient for the pt. CSW Asst Director asked the CSW to insure pt has HH in place.  CSW will speak to family.  CSW will continue to follow for D/C needs.  3:26 PM CSW spoke to family who stated they have arranged for 24 hour care in-home aide care and are en route to meet the 24 hour care now, but do not have HH.  Family recommended staff speak to the Hospice coordinator if needed\ or necessary.  Please reconsult if future social work needs arise.  CSW signing off, as social work intervention is no longer needed.  Dorothe PeaJonathan F. Jashun Puertas, LCSW, LCAS, CSI Clinical Social Worker Ph: [phone]       Dorothe PeaJonathan F. Loyd Salvador, LCSW, LCAS, CSI Clinical Social Worker Ph: [phone]

## 2017-09-18 NOTE — ED Notes (Signed)
Patient transported to CT 

## 2017-09-19 MED ORDER — CEPHALEXIN 500 MG PO CAPS: 500.0000 mg | ORAL_CAPSULE | Freq: Three times a day (TID) | ORAL | 0 refills | Status: AC

## 2017-09-20 LAB — URINE CULTURE

## 2017-09-21 ENCOUNTER — Telehealth: Payer: Self-pay | Admitting: *Deleted

## 2017-09-21 NOTE — Telephone Encounter (Signed)
Post ED Visit - Positive Culture Follow-up  Culture report reviewed by antimicrobial stewardship pharmacist:  []  Enzo BiNathan Batchelder, Pharm.D. []  Celedonio MiyamotoJeremy Frens, Pharm.D., BCPS AQ-ID []  Garvin FilaMike Maccia, Pharm.D., BCPS []  Georgina PillionElizabeth Martin, Pharm.D., BCPS []  ReaderMinh Pham, [address] BoulevardPharm.D., BCPS, AAHIVP []  Estella HuskMichelle Turner, Pharm.D., BCPS, AAHIVP []  Lysle Pearlachel Rumbarger, PharmD, BCPS []  Casilda Carlsaylor Stone, PharmD, BCPS []  Pollyann SamplesAndy Johnston, PharmD, BCPS [place]usLindsey Foltanski, PharmD  Positive urine culture Treated with Cephalexin, organism sensitive to the same and no further patient follow-up is required at this time.  Virl AxeRobertson, Ezme Duch [facility] 09/21/2017, 10:19 AM

## 2017-11-15 DEATH — deceased

## 2017-12-15 ENCOUNTER — Telehealth: Payer: Self-pay | Admitting: Internal Medicine

## 2017-12-15 NOTE — Telephone Encounter (Signed)
Copied from CRM [phone]. Topic: General - Deceased Patient >> Dec 15, 2017  9:09 AM Guinevere FerrariMorris, [place]am[place] E, NT wrote: Reason for CRM: Son called and wanted to let the doctor know that patient passed away on 11/02/2017.  Route to department's PEC Pool.

## 2017-12-15 NOTE — Telephone Encounter (Signed)
Noted  

## 2018-09-19 IMAGING — CT CT CERVICAL SPINE W/O CM
4 of 7 series · 14 of 33 positions shown, 16 images · non-contrast
Comparison: 09/06/2017

CLINICAL DATA: [AGE] female with unwitnessed fall today.
Bruising and swelling noted to the left side.

EXAM:
CT HEAD WITHOUT CONTRAST
CT CERVICAL SPINE WITHOUT CONTRAST
TECHNIQUE: Multidetector CT imaging of the head and cervical spine was
performed following the standard protocol without intravenous
contrast. Multiplanar CT image reconstructions of the cervical spine
were also generated.

[Series 4: bone windows · axial · 0.43mm/px · z∈[-119,-74]mm · 2 of 47 slices shown]
[im 16/47  bone]
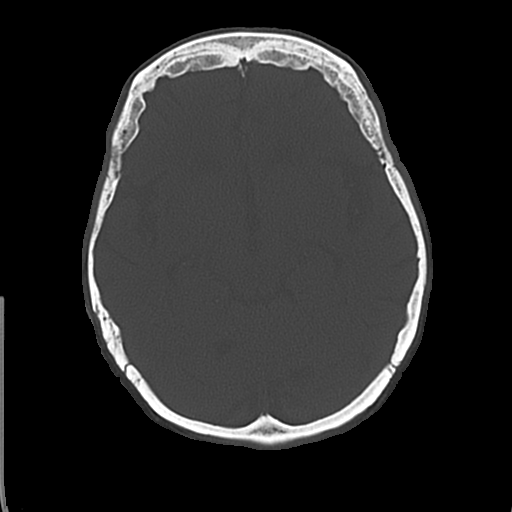
[im 31/47  bone]
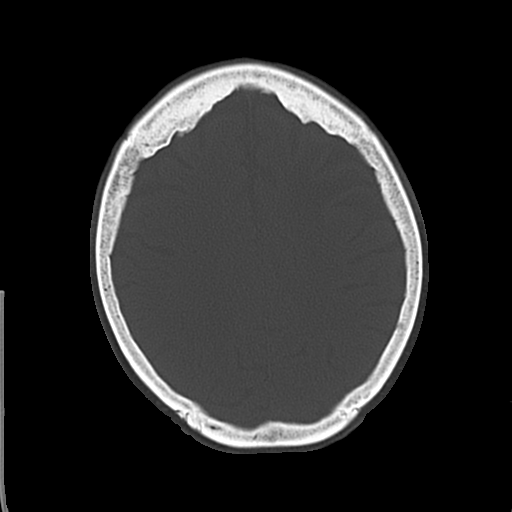

[Series 6: c-spine st · axial · 0.25mm/px · z∈[-285,-173]mm · 5 of 84 slices shown, 7 images]
[im 14/84  soft-tissue]
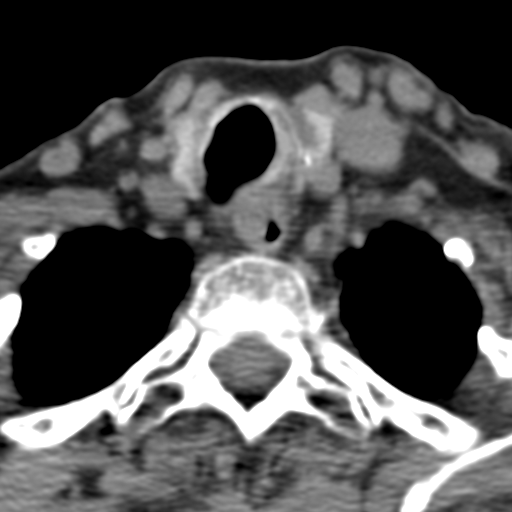
[im 14/84  bone]
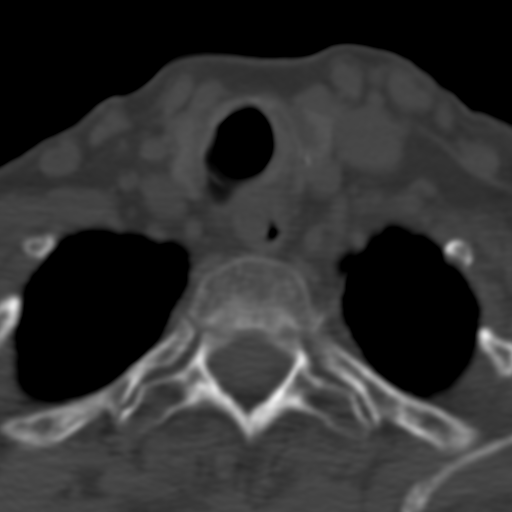
[im 28/84  bone]
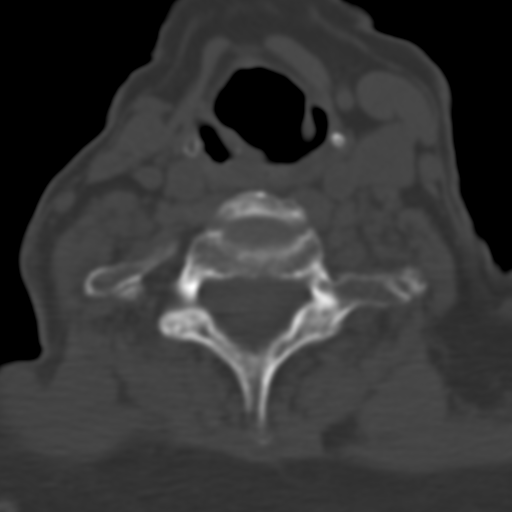
[im 42/84  bone]
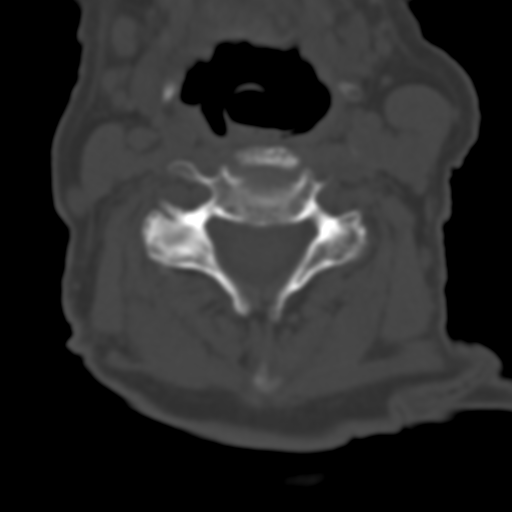
[im 56/84  bone]
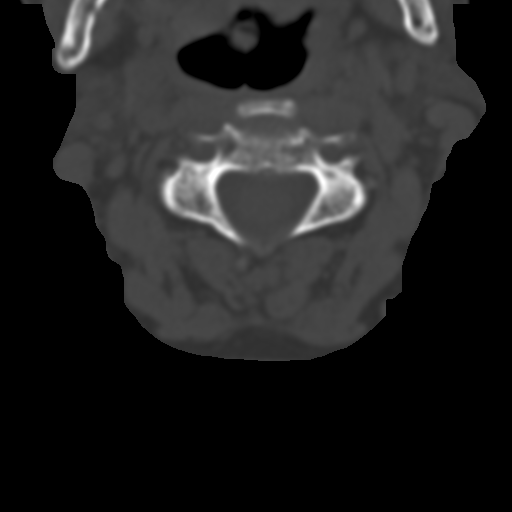
[im 70/84  soft-tissue]
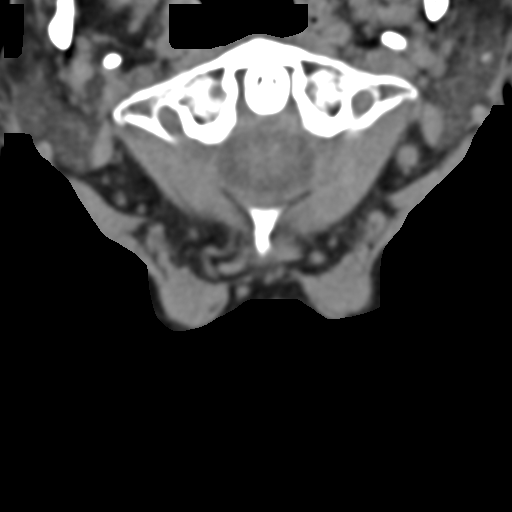
[im 70/84  bone]
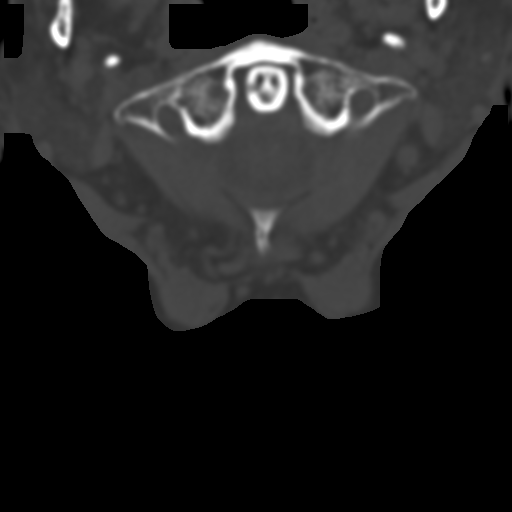

[Series 9: coronal · coronal · 0.28mm/px · 2 of 62 slices shown]
[im 21/62  bone]
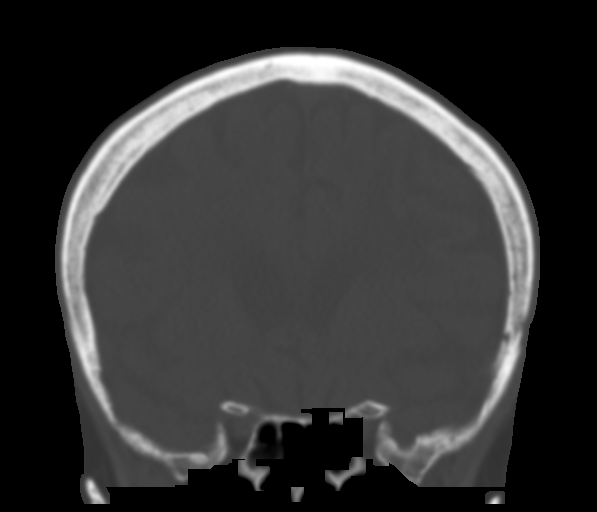
[im 41/62  bone]
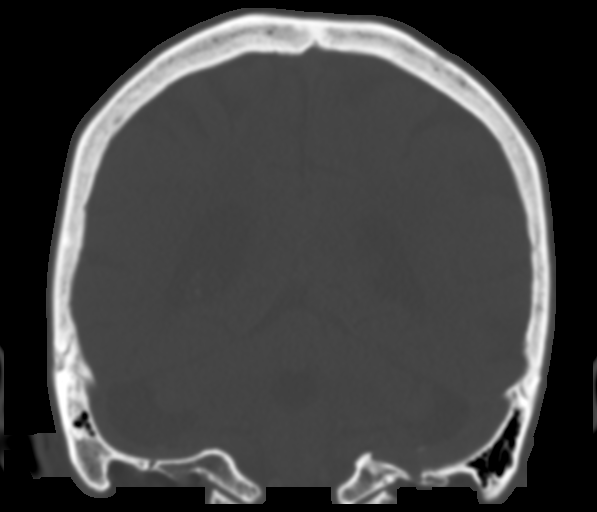

[Series 10: sagittal · sagittal · 0.30mm/px · 5 of 51 slices shown]
[im 8/51  bone]
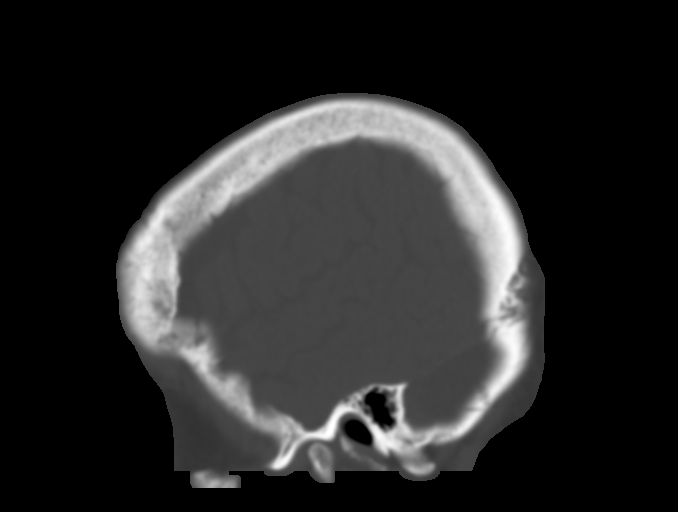
[im 15/51  bone]
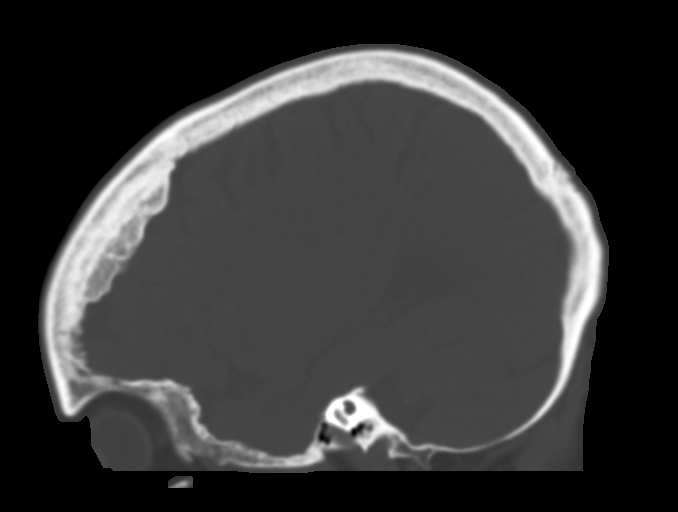
[im 22/51  bone]
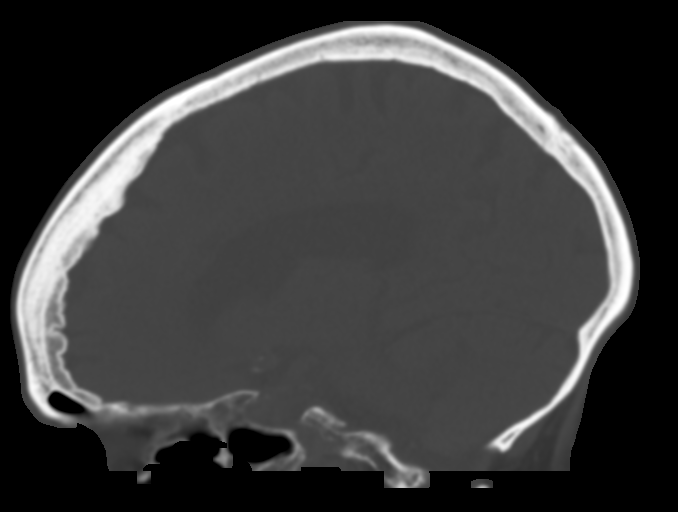
[im 29/51  bone]
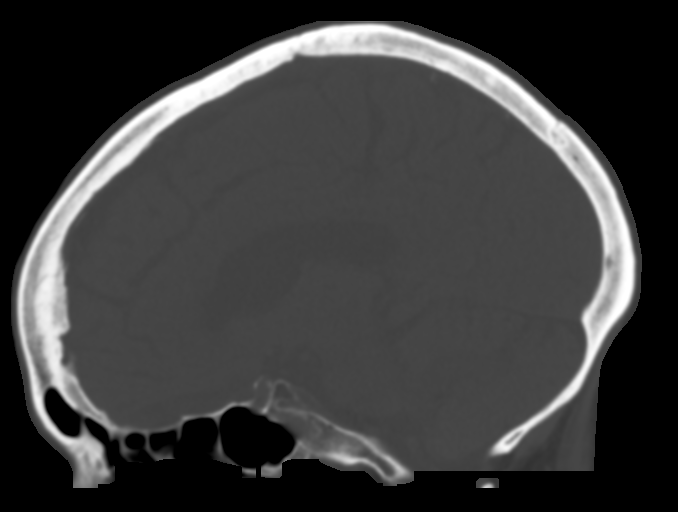
[im 36/51  bone]
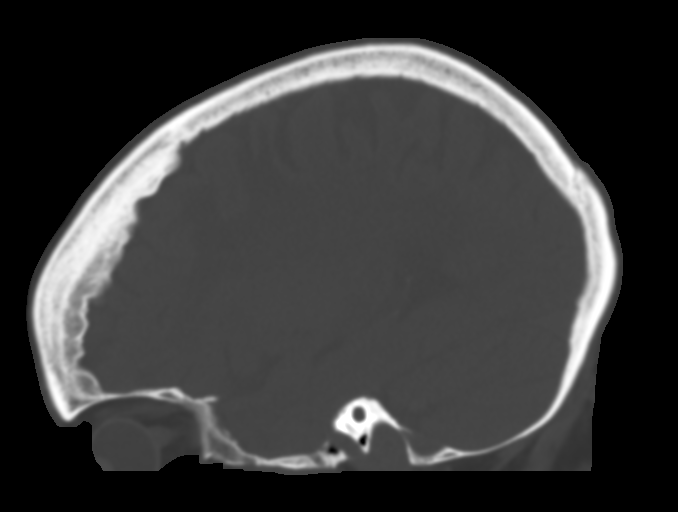

[14 of 33 positions shown; findings below may reference images not displayed]

FINDINGS: CT HEAD FINDINGS

Brain: No evidence of acute infarction, hemorrhage, hydrocephalus,
extra-axial collection or mass lesion/mass effect. Moderate diffuse
parenchymal atrophy and periventricular white matter changes, likely
representing small vessel ischemic disease, are again noted. Stable
changes of prior in fracture noted in the left frontal lobe,
posterior left cerebellum and throughout the right cerebellum.

Vascular: No hyperdense vessel. Calcifications again noted in the
carotid siphons.

Skull: Normal. Negative for fracture or focal lesion.

Sinuses/Orbits: No acute finding.

Other: None.

CT CERVICAL SPINE FINDINGS

Alignment: Normal. Straightening of the normal cervical curvature is
likely positional in nature.

Skull base and vertebrae: No acute fracture. No primary bone lesion
or focal pathologic process.

Soft tissues and spinal canal: No prevertebral fluid or swelling. No
visible canal hematoma.

Disc levels: There are mild to moderate multilevel discogenic
degenerative changes.

Upper chest: Negative.

Other: Scattered calcific foci again noted in the carotid arteries.
IMPRESSION: 1. No acute intracranial pathology. Changes of prior infarcts are
noted in the left frontal lobe and cerebellum. Chronic changes of
parenchymal atrophy and small vessel ischemic disease are also
unchanged.
2. No evidence for cervical fracture or spondylolisthesis.
Multilevel discogenic degenerative changes similar to prior study.

## 2018-09-19 IMAGING — CR DG HIP (WITH OR WITHOUT PELVIS) 5+V BILAT
5 series · 5 of 5 positions shown · non-contrast
Comparison: 01/22/2016.

CLINICAL DATA: [AGE] female status post fall. Patient has
bruising to the left hip which may be old.

EXAM:
DG HIP (WITH OR WITHOUT PELVIS) 5+V BILAT

[t pelvis ap]
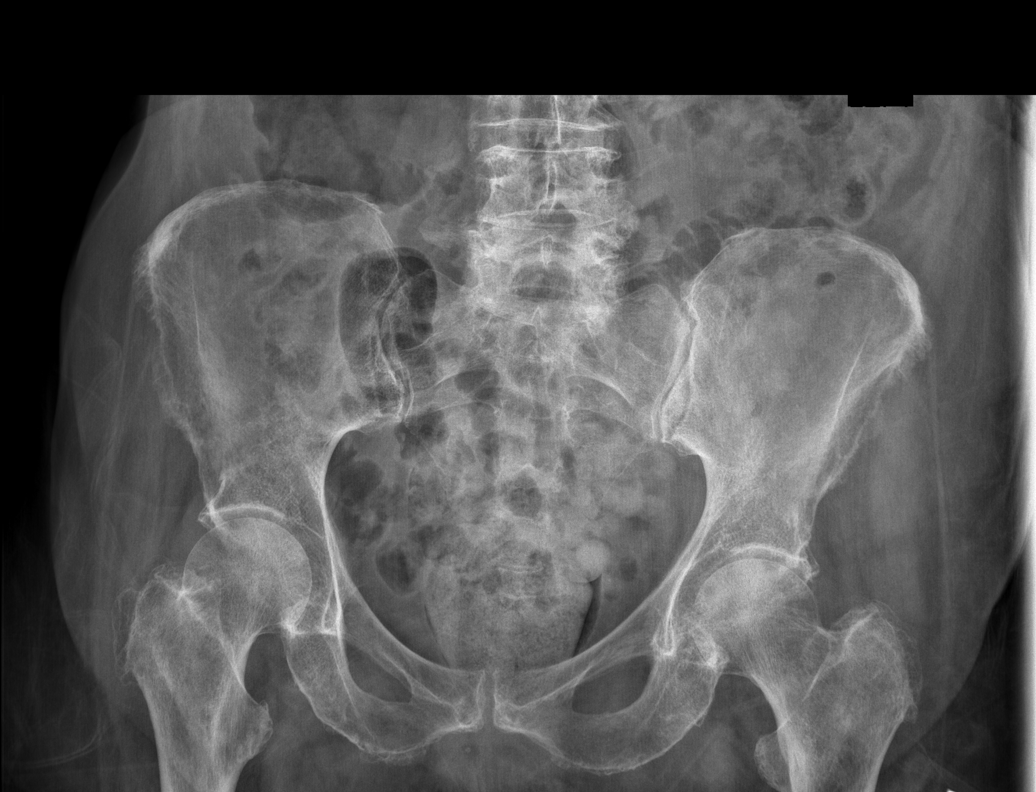

[t hip ap left]
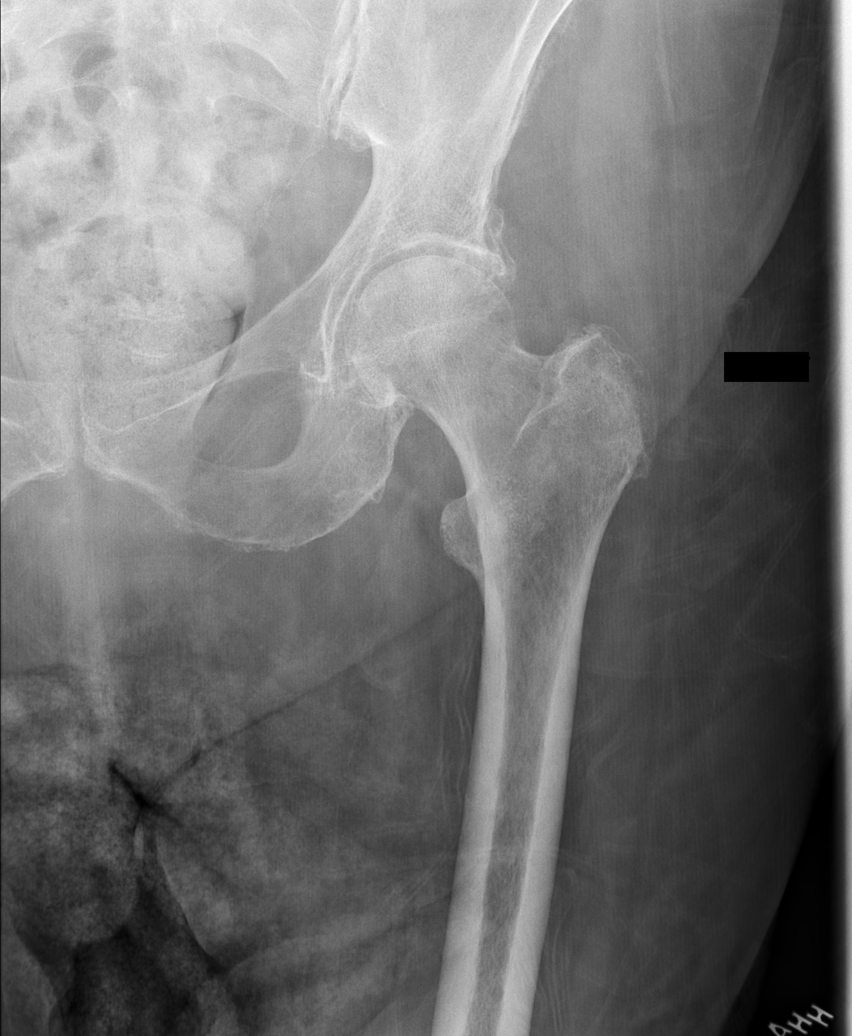

[t hip ap right]
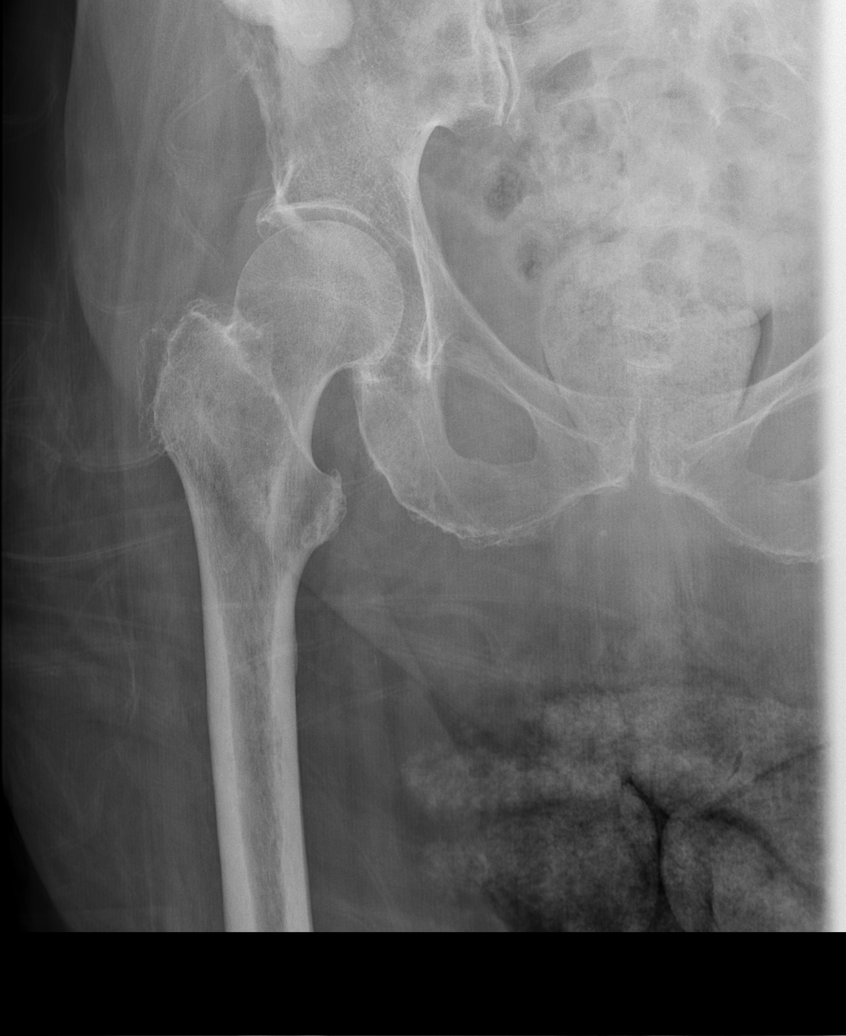

[t hip frog leg left]
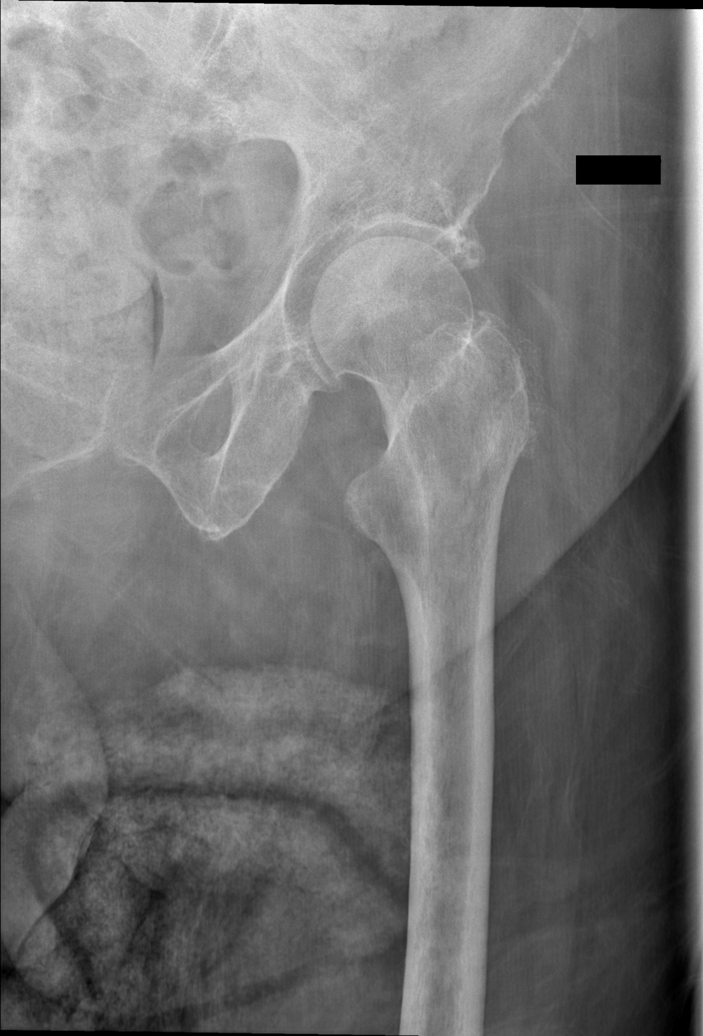

[w hip lat right]
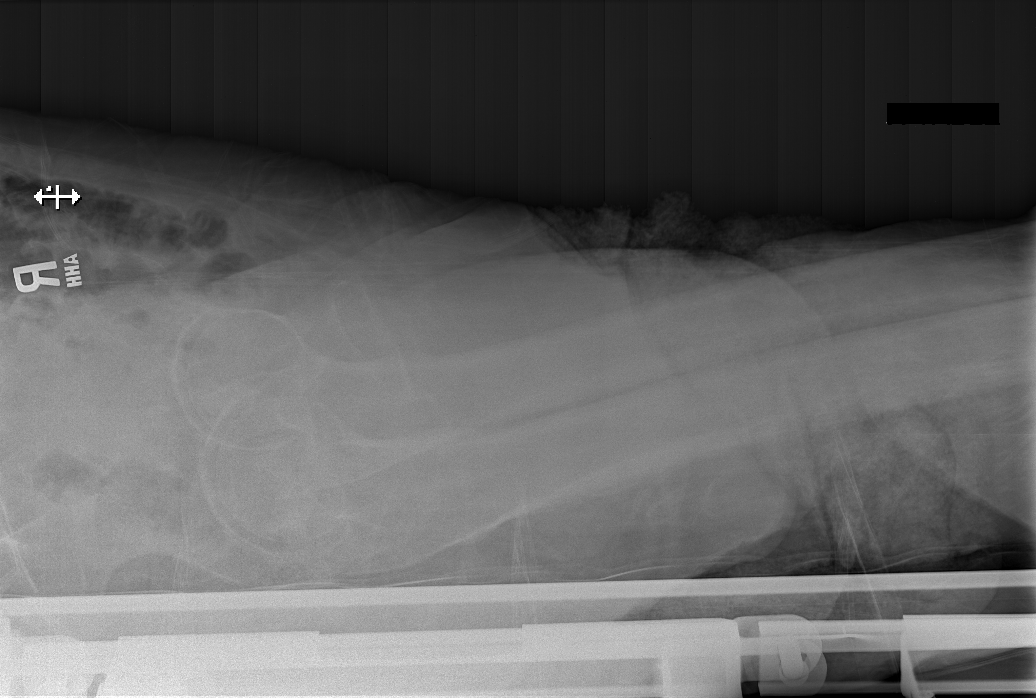

[5 of 5 positions shown; findings below may reference images not displayed]

FINDINGS: There is no definite radiographic evidence for acute hip fracture or
dislocation. However, the patient is unable to adequately externally
rotate the left or right hip, limiting evaluation in this region.
Note is made of diffuse degenerative changes without focal osseous
lesion.
IMPRESSION: No definite radiographic evidence for acute hip fracture or
dislocation. However, evaluation of the right hip is limited due to
inadequate external rotation. This could be a sign of injury to this
region. If there is high clinical suspicion for occult fracture,
further evaluation with MRI or CT should be considered.

## 2018-09-19 IMAGING — CT CT PELVIS W/O CM
2 of 3 series · 15 of 46 positions shown, 17 images · non-contrast
Comparison: Current pelvis radiographs. Previous pelvis CT,
01/22/2016

CLINICAL DATA: Fall last night/ DG xrays non-conclusive.

EXAM:
CT PELVIS WITHOUT CONTRAST
TECHNIQUE: Multidetector CT imaging of the pelvis was performed following the
standard protocol without intravenous contrast.

[Series 3: pelvis st · axial · 0.78mm/px · z∈[-240,-45]mm · 12 of 45 slices shown, 14 images]
[im 3/45  soft-tissue]
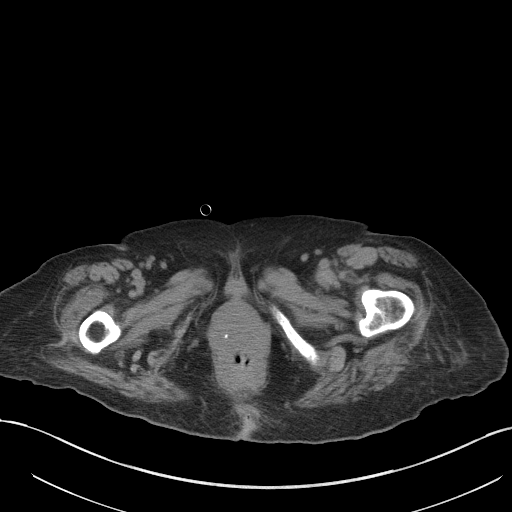
[im 3/45  bone]
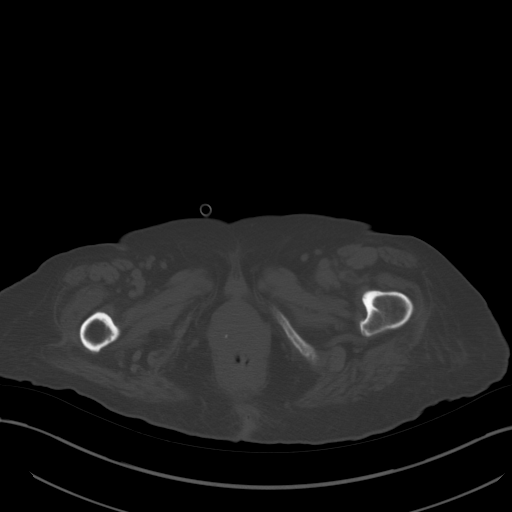
[im 6/45  soft-tissue]
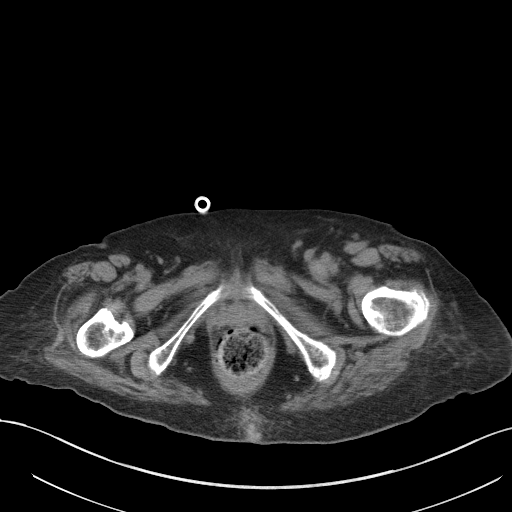
[im 10/45  soft-tissue]
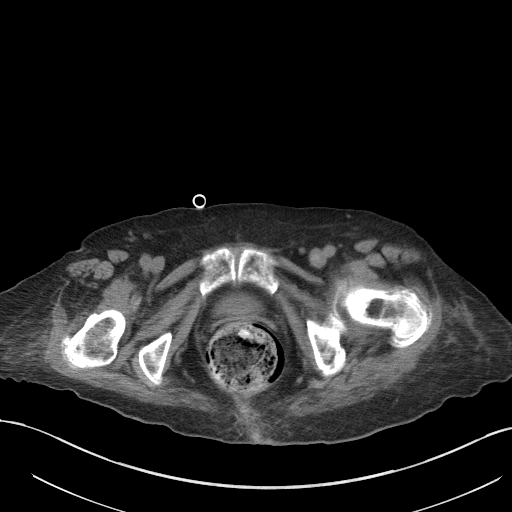
[im 13/45  soft-tissue]
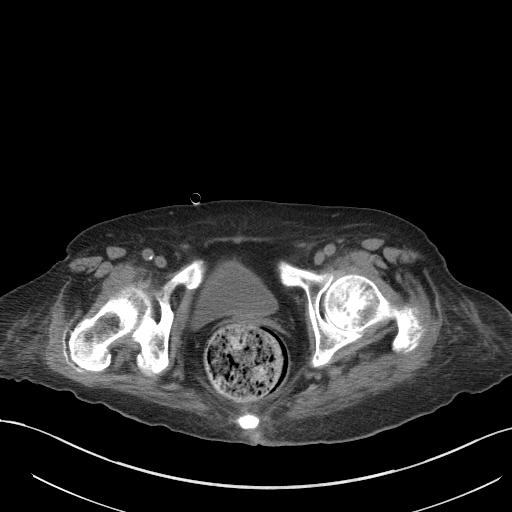
[im 18/45  soft-tissue]
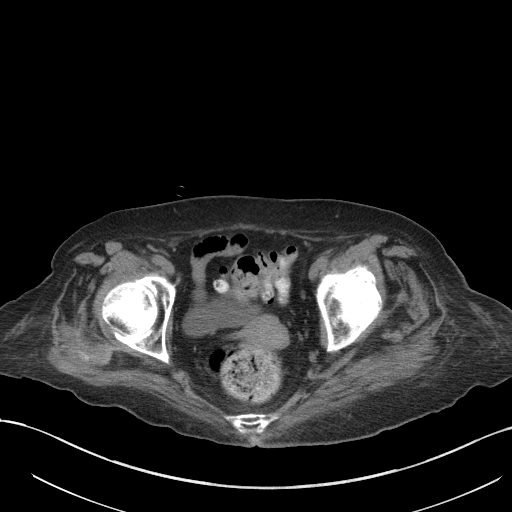
[im 20/45  soft-tissue]
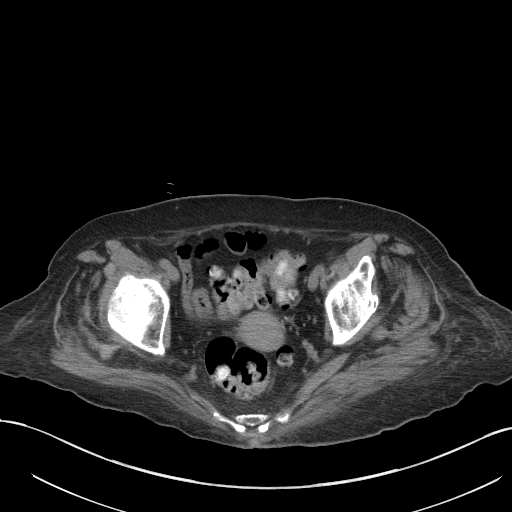
[im 25/45  soft-tissue]
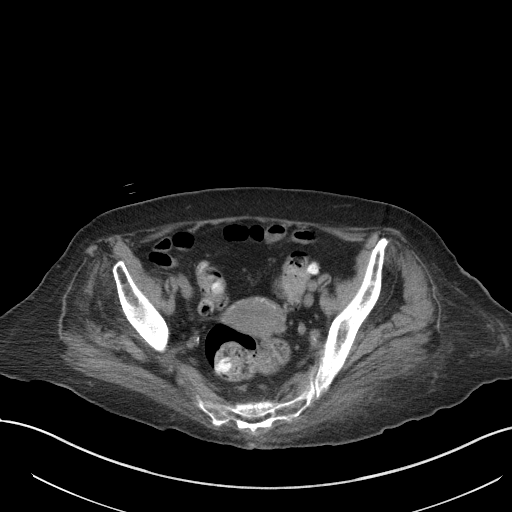
[im 27/45  soft-tissue]
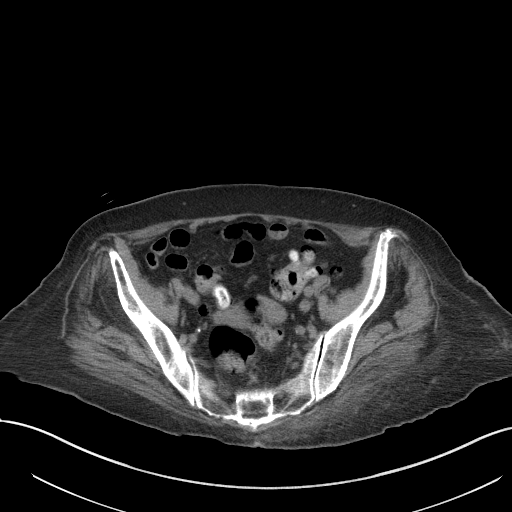
[im 32/45  soft-tissue]
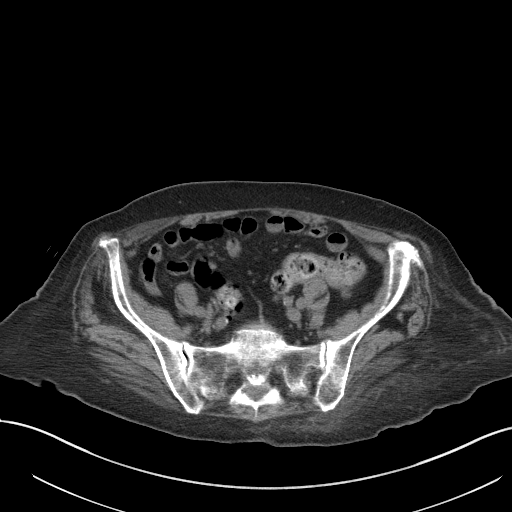
[im 32/45  bone]
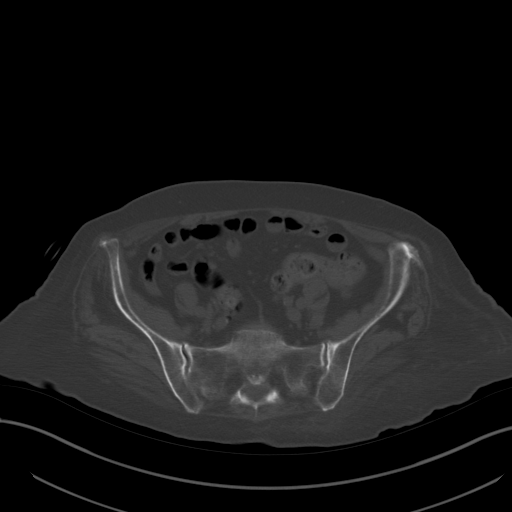
[im 35/45  soft-tissue]
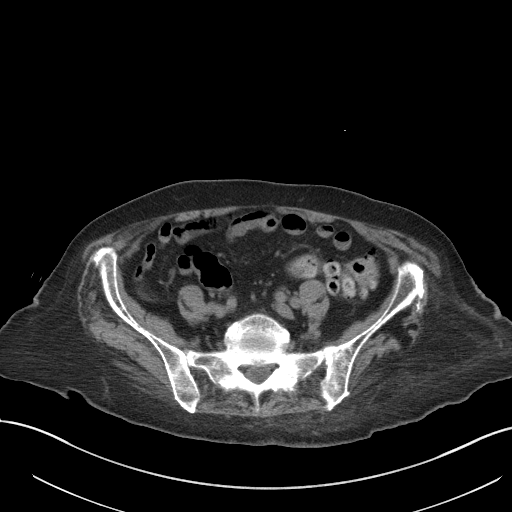
[im 39/45  soft-tissue]
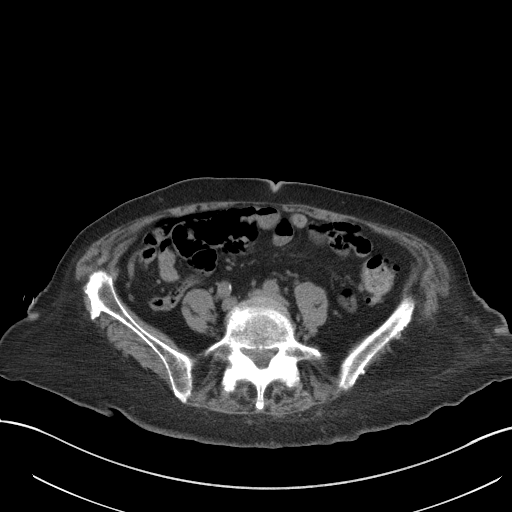
[im 42/45  soft-tissue]
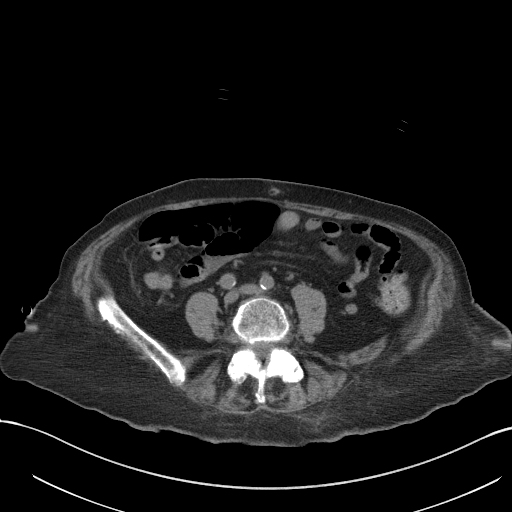

[Series 4: coronal images · coronal · 0.43mm/px · 3 of 98 slices shown]
[im 33/98  soft-tissue]
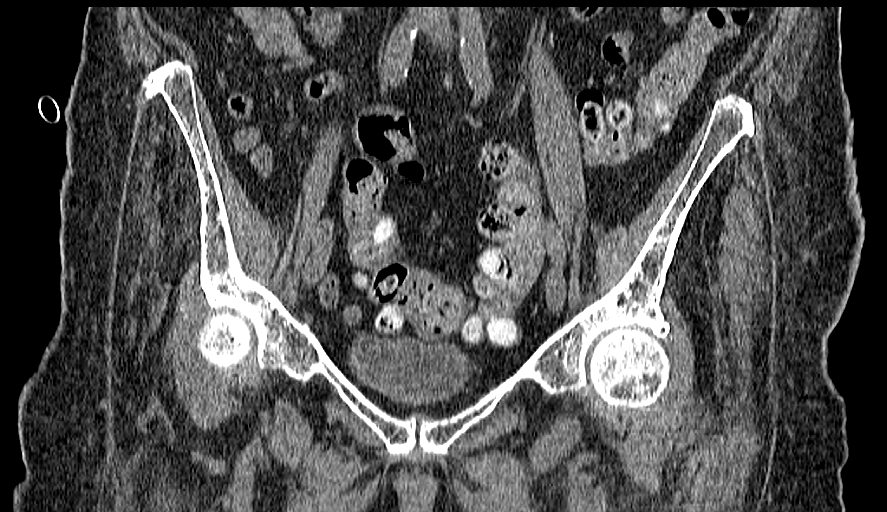
[im 44/98  soft-tissue]
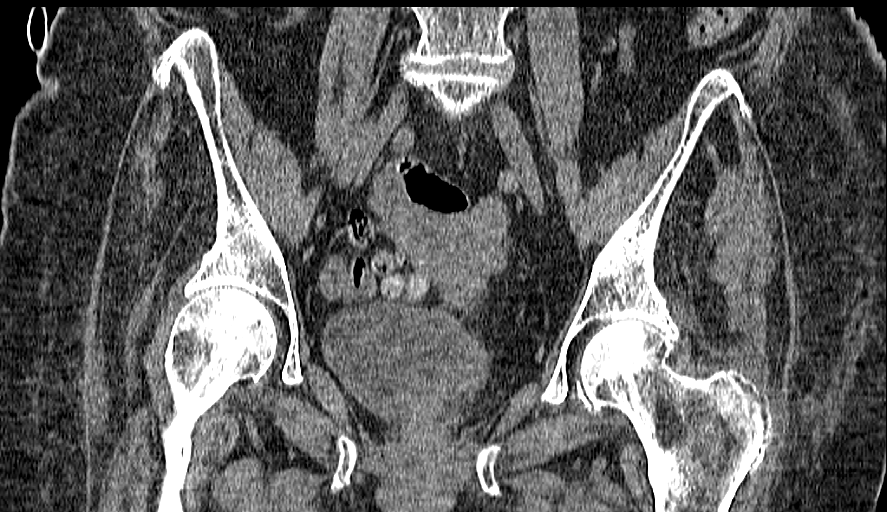
[im 54/98  soft-tissue]
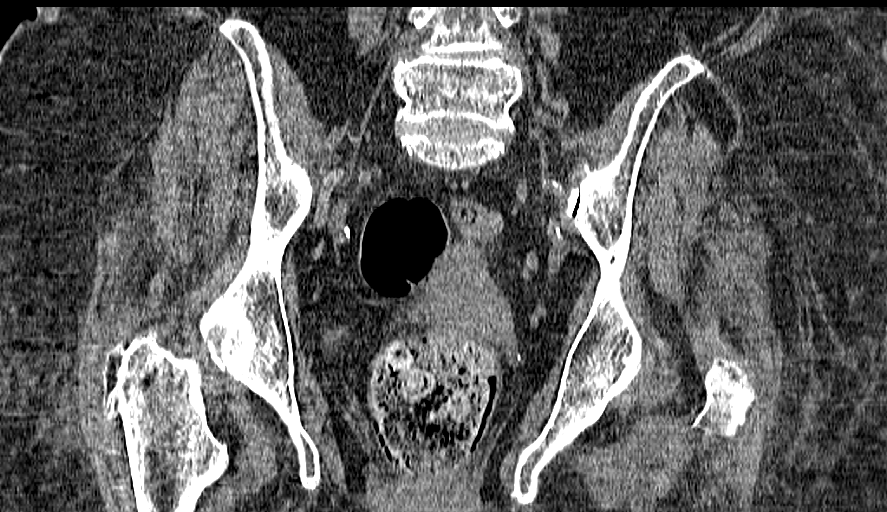

[15 of 46 positions shown; findings below may reference images not displayed]

FINDINGS: Urinary Tract:  Normal bladder.  Distal ureters normal in caliber.

Bowel: No acute finding of the visualized bowel. Multiple sigmoid
colon diverticula. No diverticulitis.

Vascular/Lymphatic: Atherosclerotic calcifications along the iliac
vessels. No aneurysm. No adenopathy.

Reproductive:  Uterus and adnexa are unremarkable.

Other:  No abdominal wall hernia.  No pelvic free fluid.

Musculoskeletal: No fracture. No bone lesion. Bones are
demineralized. Hip joints are normally aligned. No convincing joint
effusion.
IMPRESSION: 1. No fracture, dislocation or acute abnormality.

## 2019-02-28 ENCOUNTER — Encounter: Payer: Self-pay | Admitting: Internal Medicine
